# Patient Record
Sex: Female | Born: 1950
Health system: Southern US, Community
[De-identification: ages and names within clinical notes are randomized; demographics above are authoritative.]

## PROBLEM LIST (undated history)

## (undated) DIAGNOSIS — N852 Hypertrophy of uterus: Secondary | ICD-10-CM

## (undated) DIAGNOSIS — B019 Varicella without complication: Secondary | ICD-10-CM

## (undated) DIAGNOSIS — O039 Complete or unspecified spontaneous abortion without complication: Secondary | ICD-10-CM

## (undated) HISTORY — DX: Hypertrophy of uterus: N85.2

## (undated) HISTORY — PX: LUMBAR DISC SURGERY: SHX700

## (undated) HISTORY — DX: Complete or unspecified spontaneous abortion without complication: O03.9

## (undated) HISTORY — PX: BREAST CYST ASPIRATION: SHX578

## (undated) HISTORY — DX: Varicella without complication: B01.9

## (undated) HISTORY — PX: BREAST EXCISIONAL BIOPSY: SUR124

## (undated) HISTORY — PX: OOPHORECTOMY: SHX86

## (undated) HISTORY — PX: BREAST SURGERY: SHX581

---

## 1999-05-01 ENCOUNTER — Other Ambulatory Visit: Admission: RE | Admit: 1999-05-01 | Discharge: 1999-05-01 | Payer: Self-pay | Admitting: Gynecology

## 1999-08-27 ENCOUNTER — Encounter: Admission: RE | Admit: 1999-08-27 | Discharge: 1999-11-25 | Payer: Self-pay | Admitting: Gynecology

## 2000-08-04 ENCOUNTER — Other Ambulatory Visit: Admission: RE | Admit: 2000-08-04 | Discharge: 2000-08-04 | Payer: Self-pay | Admitting: Gynecology

## 2000-10-12 HISTORY — PX: LAMINECTOMY: SHX219

## 2000-10-12 LAB — HM COLONOSCOPY: HM Colonoscopy: NORMAL

## 2000-11-12 DIAGNOSIS — N852 Hypertrophy of uterus: Secondary | ICD-10-CM

## 2000-11-12 HISTORY — DX: Hypertrophy of uterus: N85.2

## 2000-11-12 HISTORY — PX: ABDOMINAL HYSTERECTOMY: SHX81

## 2000-12-09 ENCOUNTER — Inpatient Hospital Stay (HOSPITAL_COMMUNITY): Admission: RE | Admit: 2000-12-09 | Discharge: 2000-12-12 | Payer: Self-pay | Admitting: Gynecology

## 2000-12-09 ENCOUNTER — Encounter (INDEPENDENT_AMBULATORY_CARE_PROVIDER_SITE_OTHER): Payer: Self-pay

## 2001-02-07 ENCOUNTER — Encounter: Admission: RE | Admit: 2001-02-07 | Discharge: 2001-02-07 | Payer: Self-pay | Admitting: Neurosurgery

## 2001-02-07 ENCOUNTER — Encounter: Payer: Self-pay | Admitting: Neurosurgery

## 2001-02-22 ENCOUNTER — Ambulatory Visit (HOSPITAL_COMMUNITY): Admission: RE | Admit: 2001-02-22 | Discharge: 2001-02-23 | Payer: Self-pay | Admitting: Neurosurgery

## 2001-02-22 ENCOUNTER — Encounter: Payer: Self-pay | Admitting: Neurosurgery

## 2001-04-04 ENCOUNTER — Encounter: Admission: RE | Admit: 2001-04-04 | Discharge: 2001-05-03 | Payer: Self-pay | Admitting: Neurosurgery

## 2001-08-08 ENCOUNTER — Other Ambulatory Visit: Admission: RE | Admit: 2001-08-08 | Discharge: 2001-08-08 | Payer: Self-pay | Admitting: Gynecology

## 2001-11-09 ENCOUNTER — Inpatient Hospital Stay (HOSPITAL_COMMUNITY): Admission: RE | Admit: 2001-11-09 | Discharge: 2001-11-11 | Payer: Self-pay | Admitting: Neurosurgery

## 2001-11-09 ENCOUNTER — Encounter: Payer: Self-pay | Admitting: Neurosurgery

## 2002-04-06 ENCOUNTER — Encounter: Admission: RE | Admit: 2002-04-06 | Discharge: 2002-07-05 | Payer: Self-pay

## 2002-07-14 ENCOUNTER — Encounter: Admission: RE | Admit: 2002-07-14 | Discharge: 2002-10-12 | Payer: Self-pay

## 2002-08-08 ENCOUNTER — Other Ambulatory Visit: Admission: RE | Admit: 2002-08-08 | Discharge: 2002-08-08 | Payer: Self-pay | Admitting: Gynecology

## 2002-08-10 ENCOUNTER — Encounter: Admission: RE | Admit: 2002-08-10 | Discharge: 2002-08-10 | Payer: Self-pay | Admitting: Gynecology

## 2002-08-10 ENCOUNTER — Encounter: Payer: Self-pay | Admitting: Gynecology

## 2002-10-24 ENCOUNTER — Encounter: Admission: RE | Admit: 2002-10-24 | Discharge: 2003-01-22 | Payer: Self-pay

## 2003-02-12 ENCOUNTER — Encounter: Payer: Self-pay | Admitting: Gynecology

## 2003-02-12 ENCOUNTER — Encounter: Admission: RE | Admit: 2003-02-12 | Discharge: 2003-02-12 | Payer: Self-pay | Admitting: Gynecology

## 2003-07-05 ENCOUNTER — Ambulatory Visit (HOSPITAL_COMMUNITY): Admission: RE | Admit: 2003-07-05 | Discharge: 2003-07-05 | Payer: Self-pay | Admitting: Gastroenterology

## 2003-08-06 ENCOUNTER — Encounter: Admission: RE | Admit: 2003-08-06 | Discharge: 2003-08-06 | Payer: Self-pay | Admitting: Gynecology

## 2003-08-06 ENCOUNTER — Encounter: Payer: Self-pay | Admitting: Gynecology

## 2003-09-11 ENCOUNTER — Other Ambulatory Visit: Admission: RE | Admit: 2003-09-11 | Discharge: 2003-09-11 | Payer: Self-pay | Admitting: Gynecology

## 2004-08-07 ENCOUNTER — Encounter: Admission: RE | Admit: 2004-08-07 | Discharge: 2004-08-07 | Payer: Self-pay | Admitting: Gynecology

## 2004-09-16 ENCOUNTER — Other Ambulatory Visit: Admission: RE | Admit: 2004-09-16 | Discharge: 2004-09-16 | Payer: Self-pay | Admitting: Gynecology

## 2005-08-25 ENCOUNTER — Encounter: Admission: RE | Admit: 2005-08-25 | Discharge: 2005-08-25 | Payer: Self-pay | Admitting: Gynecology

## 2005-09-24 ENCOUNTER — Other Ambulatory Visit: Admission: RE | Admit: 2005-09-24 | Discharge: 2005-09-24 | Payer: Self-pay | Admitting: Gynecology

## 2006-08-27 ENCOUNTER — Encounter: Admission: RE | Admit: 2006-08-27 | Discharge: 2006-08-27 | Payer: Self-pay | Admitting: Internal Medicine

## 2006-09-08 ENCOUNTER — Encounter: Admission: RE | Admit: 2006-09-08 | Discharge: 2006-09-08 | Payer: Self-pay | Admitting: Internal Medicine

## 2006-09-23 ENCOUNTER — Encounter: Admission: RE | Admit: 2006-09-23 | Discharge: 2006-09-23 | Payer: Self-pay | Admitting: *Deleted

## 2006-12-17 ENCOUNTER — Other Ambulatory Visit: Admission: RE | Admit: 2006-12-17 | Discharge: 2006-12-17 | Payer: Self-pay | Admitting: *Deleted

## 2007-05-02 ENCOUNTER — Encounter: Admission: RE | Admit: 2007-05-02 | Discharge: 2007-05-02 | Payer: Self-pay | Admitting: *Deleted

## 2007-09-01 ENCOUNTER — Encounter: Admission: RE | Admit: 2007-09-01 | Discharge: 2007-09-01 | Payer: Self-pay | Admitting: *Deleted

## 2007-12-28 ENCOUNTER — Other Ambulatory Visit: Admission: RE | Admit: 2007-12-28 | Discharge: 2007-12-28 | Payer: Self-pay | Admitting: Gynecology

## 2008-03-29 ENCOUNTER — Ambulatory Visit: Payer: Self-pay | Admitting: Family Medicine

## 2008-03-29 DIAGNOSIS — D1800 Hemangioma unspecified site: Secondary | ICD-10-CM | POA: Insufficient documentation

## 2008-03-29 LAB — CONVERTED CEMR LAB: Pap Smear: NORMAL

## 2008-09-04 ENCOUNTER — Encounter: Admission: RE | Admit: 2008-09-04 | Discharge: 2008-09-04 | Payer: Self-pay | Admitting: Family Medicine

## 2008-09-14 ENCOUNTER — Telehealth: Payer: Self-pay | Admitting: Family Medicine

## 2009-01-03 ENCOUNTER — Other Ambulatory Visit: Admission: RE | Admit: 2009-01-03 | Discharge: 2009-01-03 | Payer: Self-pay | Admitting: Gynecology

## 2009-01-03 ENCOUNTER — Encounter: Payer: Self-pay | Admitting: Women's Health

## 2009-01-03 ENCOUNTER — Ambulatory Visit: Payer: Self-pay | Admitting: Women's Health

## 2009-02-11 ENCOUNTER — Ambulatory Visit: Payer: Self-pay | Admitting: Family Medicine

## 2009-02-11 DIAGNOSIS — M25559 Pain in unspecified hip: Secondary | ICD-10-CM | POA: Insufficient documentation

## 2009-02-11 LAB — CONVERTED CEMR LAB
ALT: 12 units/L (ref 0–35)
AST: 19 units/L (ref 0–37)
Albumin: 4.1 g/dL (ref 3.5–5.2)
Alkaline Phosphatase: 64 units/L (ref 39–117)
BUN: 10 mg/dL (ref 6–23)
Basophils Absolute: 0 10*3/uL (ref 0.0–0.1)
Basophils Relative: 0 % (ref 0.0–3.0)
Bilirubin, Direct: 0.1 mg/dL (ref 0.0–0.3)
CO2: 35 meq/L — ABNORMAL HIGH (ref 19–32)
Calcium: 10 mg/dL (ref 8.4–10.5)
Chloride: 107 meq/L (ref 96–112)
Cholesterol: 182 mg/dL (ref 0–200)
Creatinine, Ser: 0.8 mg/dL (ref 0.4–1.2)
Eosinophils Absolute: 0 10*3/uL (ref 0.0–0.7)
Eosinophils Relative: 0.9 % (ref 0.0–5.0)
GFR calc non Af Amer: 94.69 mL/min (ref >60)
Glucose, Bld: 99 mg/dL (ref 70–99)
HCT: 41 % (ref 36.0–46.0)
HDL: 62.4 mg/dL (ref >39.00)
Hemoglobin: 14.1 g/dL (ref 12.0–15.0)
LDL Cholesterol: 104 mg/dL — ABNORMAL HIGH (ref 0–99)
Lymphocytes Relative: 30.9 % (ref 12.0–46.0)
Lymphs Abs: 1.4 10*3/uL (ref 0.7–4.0)
MCHC: 34.5 g/dL (ref 30.0–36.0)
MCV: 86 fL (ref 78.0–100.0)
Monocytes Absolute: 0.2 10*3/uL (ref 0.1–1.0)
Monocytes Relative: 5.2 % (ref 3.0–12.0)
Neutro Abs: 2.9 10*3/uL (ref 1.4–7.7)
Neutrophils Relative %: 63 % (ref 43.0–77.0)
Platelets: 186 10*3/uL (ref 150.0–400.0)
Potassium: 4.6 meq/L (ref 3.5–5.1)
RBC: 4.77 M/uL (ref 3.87–5.11)
RDW: 12.7 % (ref 11.5–14.6)
Sodium: 146 meq/L — ABNORMAL HIGH (ref 135–145)
TSH: 1.25 microintl units/mL (ref 0.35–5.50)
Total Bilirubin: 1.3 mg/dL — ABNORMAL HIGH (ref 0.3–1.2)
Total CHOL/HDL Ratio: 3
Total Protein: 7.5 g/dL (ref 6.0–8.3)
Triglycerides: 76 mg/dL (ref 0.0–149.0)
VLDL: 15.2 mg/dL (ref 0.0–40.0)
WBC: 4.5 10*3/uL (ref 4.5–10.5)

## 2009-05-13 ENCOUNTER — Ambulatory Visit: Payer: Self-pay | Admitting: Family Medicine

## 2009-05-13 ENCOUNTER — Telehealth: Payer: Self-pay | Admitting: Family Medicine

## 2009-05-13 DIAGNOSIS — M5020 Other cervical disc displacement, unspecified cervical region: Secondary | ICD-10-CM

## 2009-05-14 ENCOUNTER — Telehealth: Payer: Self-pay | Admitting: Family Medicine

## 2009-05-18 ENCOUNTER — Encounter: Admission: RE | Admit: 2009-05-18 | Discharge: 2009-05-18 | Payer: Self-pay | Admitting: Neurosurgery

## 2009-09-17 ENCOUNTER — Encounter: Admission: RE | Admit: 2009-09-17 | Discharge: 2009-09-17 | Payer: Self-pay | Admitting: Family Medicine

## 2009-11-07 ENCOUNTER — Ambulatory Visit: Payer: Self-pay | Admitting: Family Medicine

## 2009-12-26 ENCOUNTER — Ambulatory Visit: Payer: Self-pay | Admitting: Women's Health

## 2010-01-09 ENCOUNTER — Other Ambulatory Visit: Admission: RE | Admit: 2010-01-09 | Discharge: 2010-01-09 | Payer: Self-pay | Admitting: Gynecology

## 2010-01-09 ENCOUNTER — Ambulatory Visit: Payer: Self-pay | Admitting: Women's Health

## 2010-01-10 LAB — CONVERTED CEMR LAB: Pap Smear: NORMAL

## 2010-01-20 ENCOUNTER — Ambulatory Visit: Payer: Self-pay | Admitting: Internal Medicine

## 2010-01-20 DIAGNOSIS — M79609 Pain in unspecified limb: Secondary | ICD-10-CM

## 2010-02-24 ENCOUNTER — Ambulatory Visit: Payer: Self-pay | Admitting: Internal Medicine

## 2010-02-27 ENCOUNTER — Ambulatory Visit: Payer: Self-pay | Admitting: Internal Medicine

## 2010-02-28 LAB — CONVERTED CEMR LAB
BUN: 16 mg/dL (ref 6–23)
CO2: 30 meq/L (ref 19–32)
Calcium: 9.4 mg/dL (ref 8.4–10.5)
Chloride: 101 meq/L (ref 96–112)
Creatinine, Ser: 0.8 mg/dL (ref 0.4–1.2)
GFR calc non Af Amer: 90.42 mL/min (ref >60)
Glucose, Bld: 91 mg/dL (ref 70–99)
Potassium: 5.7 meq/L — ABNORMAL HIGH (ref 3.5–5.1)
Sodium: 140 meq/L (ref 135–145)

## 2010-03-11 ENCOUNTER — Ambulatory Visit: Payer: Self-pay | Admitting: Internal Medicine

## 2010-03-12 LAB — CONVERTED CEMR LAB
BUN: 11 mg/dL (ref 6–23)
CO2: 32 meq/L (ref 19–32)
Calcium: 9.7 mg/dL (ref 8.4–10.5)
Chloride: 101 meq/L (ref 96–112)
Creatinine, Ser: 0.9 mg/dL (ref 0.4–1.2)
GFR calc non Af Amer: 85.63 mL/min (ref >60)
Glucose, Bld: 97 mg/dL (ref 70–99)
Potassium: 5.6 meq/L — ABNORMAL HIGH (ref 3.5–5.1)
Sodium: 141 meq/L (ref 135–145)

## 2010-03-13 ENCOUNTER — Ambulatory Visit: Payer: Self-pay | Admitting: Internal Medicine

## 2010-03-14 LAB — CONVERTED CEMR LAB
Creatinine, Ser: 0.7 mg/dL (ref 0.4–1.2)
Potassium: 3.9 meq/L (ref 3.5–5.1)
Sodium: 140 meq/L (ref 135–145)

## 2010-07-30 ENCOUNTER — Ambulatory Visit: Payer: Self-pay | Admitting: Internal Medicine

## 2010-09-23 ENCOUNTER — Encounter
Admission: RE | Admit: 2010-09-23 | Discharge: 2010-09-23 | Payer: Self-pay | Source: Home / Self Care | Attending: Internal Medicine | Admitting: Internal Medicine

## 2010-09-23 LAB — HM MAMMOGRAPHY

## 2010-11-09 LAB — CONVERTED CEMR LAB
ALT: 13 units/L (ref 0–35)
AST: 20 units/L (ref 0–37)
Albumin: 4.3 g/dL (ref 3.5–5.2)
Alkaline Phosphatase: 57 units/L (ref 39–117)
BUN: 14 mg/dL (ref 6–23)
Basophils Absolute: 0 10*3/uL (ref 0.0–0.1)
Basophils Relative: 0.4 % (ref 0.0–3.0)
Bilirubin Urine: NEGATIVE
Bilirubin, Direct: 0.2 mg/dL (ref 0.0–0.3)
Blood in Urine, dipstick: NEGATIVE
CO2: 33 meq/L — ABNORMAL HIGH (ref 19–32)
Calcium: 10.1 mg/dL (ref 8.4–10.5)
Chloride: 105 meq/L (ref 96–112)
Cholesterol: 175 mg/dL (ref 0–200)
Creatinine, Ser: 0.8 mg/dL (ref 0.4–1.2)
Eosinophils Absolute: 0.1 10*3/uL (ref 0.0–0.7)
Eosinophils Relative: 1.6 % (ref 0.0–5.0)
GFR calc non Af Amer: 90.42 mL/min (ref >60)
Glucose, Bld: 99 mg/dL (ref 70–99)
Glucose, Urine, Semiquant: NEGATIVE
HCT: 40.8 % (ref 36.0–46.0)
HDL: 65 mg/dL (ref >39.00)
Hemoglobin: 14.1 g/dL (ref 12.0–15.0)
Hgb A1c MFr Bld: 5.8 % (ref 4.6–6.5)
Ketones, urine, test strip: NEGATIVE
LDL Cholesterol: 98 mg/dL (ref 0–99)
Lymphocytes Relative: 31.1 % (ref 12.0–46.0)
Lymphs Abs: 1.3 10*3/uL (ref 0.7–4.0)
MCHC: 34.6 g/dL (ref 30.0–36.0)
MCV: 85.1 fL (ref 78.0–100.0)
Monocytes Absolute: 0.3 10*3/uL (ref 0.1–1.0)
Monocytes Relative: 7.1 % (ref 3.0–12.0)
Neutro Abs: 2.5 10*3/uL (ref 1.4–7.7)
Neutrophils Relative %: 59.8 % (ref 43.0–77.0)
Nitrite: NEGATIVE
Platelets: 211 10*3/uL (ref 150.0–400.0)
Potassium: 5.6 meq/L — ABNORMAL HIGH (ref 3.5–5.1)
Protein, U semiquant: NEGATIVE
RBC: 4.8 M/uL (ref 3.87–5.11)
RDW: 13.9 % (ref 11.5–14.6)
Sodium: 146 meq/L — ABNORMAL HIGH (ref 135–145)
Specific Gravity, Urine: 1.02
TSH: 1.91 microintl units/mL (ref 0.35–5.50)
Total Bilirubin: 1.1 mg/dL (ref 0.3–1.2)
Total CHOL/HDL Ratio: 3
Total Protein: 7.1 g/dL (ref 6.0–8.3)
Triglycerides: 60 mg/dL (ref 0.0–149.0)
Urobilinogen, UA: 0.2
VLDL: 12 mg/dL (ref 0.0–40.0)
WBC: 4.2 10*3/uL — ABNORMAL LOW (ref 4.5–10.5)
pH: 7

## 2010-11-11 NOTE — Assessment & Plan Note (Signed)
Summary: fup meds//ccm   Vital Signs:  Patient profile:   60 year old female Weight:      144 pounds Temp:     98.6 degrees F oral BP sitting:   110 / 70  (left arm) Cuff size:   regular  Vitals Entered By: Kern Reap CMA Duncan Dull) (November 07, 2009 2:41 PM)  Reason for Visit med check  History of Present Illness: Elizabeth Rollins is a 60 year old female, nonsmoker, who states that she is on disability because of chronic back pain.  She last saw her neurosurgeon, Dr. Jeral Fruit in September of 2010.  At that time.  He recommended either epidural steroid injections or Mobic 15 mg daily.  She selected.  The study on the Mobic 15 mg daily.  She states she is frustrated because we do not have the details of her neurosurgical evaluation with Dr. Jeral Fruit.  I explained to her that I do not need the details of her neurosurgical evaluation because any treatment is really provided by him.  She is due in March for annual exam.  She is here today to get her Mobic refill  Allergies: 1)  ! Sulfa  Past History:  Past medical, surgical, family and social histories (including risk factors) reviewed for relevance to current acute and chronic problems.  Past Medical History: Reviewed history from 03/29/2008 and no changes required. lumbar disk surgery x 2 TAH and BSO for nonmalignant reasons miscarriage x 1.  No children  Family History: Reviewed history from 03/29/2008 and no changes required. father died at 35, MI, and diabetes.  Mother died 29, pulmonary embolus, hypertension.  Three brothers two diabetic one in good health.  Two sisters one has allergies.  There is diabetes and congestive heart failure  Social History: Reviewed history from 03/29/2008 and no changes required. Occupation: Married Never Smoked Alcohol use-no Drug use-no Regular exercise-yes  Physical Exam  General:  Well-developed,well-nourished,in no acute distress; alert,appropriate and cooperative throughout  examination   Impression & Recommendations:  Problem # 1:  DEGENERATIVE DISC DISEASE, CERVICAL SPINE, W/RADICULOPATHY (ICD-722.0) Assessment Unchanged  Orders: Prescription Created Electronically [phone])  Complete Medication List: 1)  Vitamin D  2)  Calcium  3)  Fish Oil  4)  Vitamin E  5)  Garlic  6)  Magnesium  7)  Meloxicam 15 Mg Tabs (Meloxicam) .... Take one tab once daily  Patient Instructions: 1)  continue the Mobic 15 mg daily.  Schedule a physical examination in March.  We will do your lab work.  The same day, you come in for your physical Prescriptions: MELOXICAM 15 MG TABS (MELOXICAM) take one tab once daily  #100 x 3   Entered and Authorized by:   Roderick Pee MD   Signed by:   Roderick Pee MD on 11/07/2009   Method used:   Electronically to        [facility]. [phone]* (retail)       [address]       [place], [place]  [address]       Ph: [phone]       Fax: [phone]   RxID:   [id number]

## 2010-11-11 NOTE — Assessment & Plan Note (Signed)
Summary: cpx---will fast--ok per dr Fabian Sharp to work in//ccm   Vital Signs:  Patient profile:   60 year old female Menstrual status:  hysterectomy Height:      66.5 inches Weight:      139 pounds BMI:     22.18 Temp:     98.2 degrees F oral Pulse rate:   92 / minute BP sitting:   104 / 80  (right arm)  Vitals Entered By: Kathrynn Speed CMA (Feb 24, 2010 8:39 AM) CC: CPX/ No Pap hyst has gyn Nutritional Status Detail -has   History of Present Illness: Elizabeth Rollins  comes in today for preventive visit .  Since last visit  : In regard to her radicular neck arm .symptoms    she  DId see  Dr Jeral Fruit  and   prednisone  had  helped.   is to be on meloxicam for 8 weeks  and  still has some numbness no pain.  In home traction rx .    some help.    to follow up with him after completion of course of program otherwise health is good . Concern about diabetes in family but no hx of same .   no signs of diabetes. tries to stay  follow healthy lifestyle   Preventive Care Screening  Prior Values:    Pap Smear:  normal (01/10/2010)    Mammogram:  ASSESSMENT: Negative - BI-RADS 1^MM DIGITAL SCREENING (09/17/2009)    Colonoscopy:  normal (10/12/2000)   Preventive Screening-Counseling & Management  Alcohol-Tobacco     Alcohol drinks/day: 0     Smoking Status: quit     Year Quit: 1985  Caffeine-Diet-Exercise     Caffeine use/day: 0     Does Patient Exercise: yes     Type of exercise: swimming    water aerobic      Times/week: 3  Hep-HIV-STD-Contraception     Dental Visit-last 6 months yes  Safety-Violence-Falls     Seat Belt Use: yes     Firearms in the Home: no firearms in the home     Smoke Detectors: yes     Violence in the Home: no risk noted     Sexual Abuse: no     Fall Risk: no       Drug Use:  no.        Blood Transfusions:  no.    Current Medications (verified): 1)  Vitamin D 2)  Calcium 3)  Fish Oil 4)  Garlic 5)  Magnesium 6)  Meloxicam 15 Mg Tabs (Meloxicam)  .... Take One Tab Once Daily  Allergies (verified): 1)  ! Sulfa  Past History:  Past medical, surgical, family and social histories (including risk factors) reviewed, and no changes noted (except as noted below).  Past Medical History: Reviewed history from 01/20/2010 and no changes required. lumbar disk surgery x 2 TAH and BSO for nonmalignant reasons  bleeding  miscarriage x 1.  No children Varicella as a child  Past Surgical History: Reviewed history from 01/20/2010 and no changes required. laminectomy  2002  and   repeat  Hysterectomy  Past History:  Care Management: Gynecology: Perlie Gold and then Daphine Deutscher over 1 year ago  Neurology: Erma Heritage  Family History: Reviewed history from 03/29/2008 and no changes required. father died at 21, MI, and diabetes.  Mother died 15, pulmonary embolus, hypertension.  Three brothers two diabetic one in good health.  Two sisters one has allergies.  There is diabetes and congestive heart  failure ( 3 sis with diabetes  and on prediabetes)   Social History: Reviewed history from 01/20/2010 and no changes required. 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  [employer]   am tobacco  Married  HHof 2  Never Smoked Alcohol use-no Drug use-no Regular exercise-yes Fall Risk:  no   Review of Systems       12 system review  negative except  tthe neck back arm radicular signs   wears glasses  Physical Exam General Appearance: well developed, well nourished, no acute distress Eyes: conjunctiva and lids normal, PERRLA, EOM  soe tropism wearing glasses  Ears, Nose, Mouth, Throat: TM clear, nares clear, oral exam WNL Neck: supple, no lymphadenopathy, no thyromegaly, no JVD Respiratory: clear to auscultation and percussion, respiratory effort normal Cardiovascular: regular rate and rhythm, S1-S2, no murmur, rub or gallop, no bruits, peripheral pulses normal and symmetric, no cyanosis, clubbing, edema  or varicosities Chest: no scars, masses, tenderness; no asymmetry, skin changes, nipple discharge   Gastrointestinal: soft, non-tender; no hepatosplenomegaly, masses; active bowel sounds all quadrants,  Genitourinary: per GYne pap march 2011 Lymphatic: no cervical, axillary or inguinal adenopathy Musculoskeletal: gait normal, muscle tone and strength WNL, no joint swelling, effusions, discoloration, crepitus  Skin: clear, good turgor, color WNL, no rashes, lesions, or ulcerations Neurologic: normal mental status, normal reflexes, normal strength, , and motion dec sense  percieved ring pinky on right .  slightl stiffness in neck .  Psychiatric: alert; oriented to person, place and time Other Exam:   EKG  rate 70   ectopic pwaves /   r r nl intervals .       Impression & Recommendations:  Problem # 1:  PREVENTIVE HEALTH CARE (ICD-V70.0) continue healthy lifestyle   Orders: TLB-TSH (Thyroid Stimulating Hormone) (84443-TSH) TLB-Hepatic/Liver Function Pnl (80076-HEPATIC) TLB-CBC Platelet - w/Differential (85025-CBCD) TLB-BMP (Basic Metabolic Panel-BMET) (80048-METABOL) TLB-Lipid Panel (80061-LIPID) TLB-A1C / Hgb A1C (Glycohemoglobin) (83036-A1C) UA Dipstick w/o Micro (automated)  (81003) Venipuncture (86578) EKG w/ Interpretation (93000)  Problem # 2:  DEGENERATIVE DISC DISEASE, CERVICAL SPINE, W/RADICULOPATHY (ICD-722.0) under care  some improvement  with traction exercises and  nsaid .   TO follow up with Dr Jeral Fruit . Orders: TLB-TSH (Thyroid Stimulating Hormone) (84443-TSH) TLB-Hepatic/Liver Function Pnl (80076-HEPATIC) TLB-CBC Platelet - w/Differential (85025-CBCD) TLB-BMP (Basic Metabolic Panel-BMET) (80048-METABOL) TLB-Lipid Panel (80061-LIPID)  Problem # 3:  DIABETES MELLITUS, FAMILY HX (ICD-V18.0) very strong hx of dm ...   continue exercise as tolerated and healthy lifestyle  Orders: TLB-TSH (Thyroid Stimulating Hormone) (84443-TSH) TLB-Hepatic/Liver Function Pnl  (80076-HEPATIC) TLB-CBC Platelet - w/Differential (85025-CBCD) TLB-BMP (Basic Metabolic Panel-BMET) (80048-METABOL) TLB-Lipid Panel (80061-LIPID) TLB-A1C / Hgb A1C (Glycohemoglobin) (83036-A1C)  Complete Medication List: 1)  Vitamin D  2)  Calcium  3)  Fish Oil  4)  Garlic  5)  Magnesium  6)  Meloxicam 15 Mg Tabs (Meloxicam) .... Take one tab once daily  Other Orders: Tdap => 11yrs IM (46962) Admin 1st Vaccine (95284)  Patient Instructions: 1)  You will be informed of lab results when available.  2)  Continue healthy lifestyle   3)  Check up yearly    or as needed.   requests copy of labs   Immunizations Administered:  Tetanus Vaccine:    Vaccine Type: Tdap    Site: right deltoid    Mfr: GlaxoSmithKline    Dose: 0.5 ml    Route: IM    Given by: Romualdo Bolk, CMA (AAMA)    Exp. Date:  01/04/2012    Lot #: [id number]  Laboratory Results   Urine Tests    Routine Urinalysis   Color: yellow Appearance: Clear Glucose: negative   (Normal Range: Negative) Bilirubin: negative   (Normal Range: Negative) Ketone: negative   (Normal Range: Negative) Spec. Gravity: 1.020   (Normal Range: 1.003-1.035) Blood: negative   (Normal Range: Negative) pH: 7.0   (Normal Range: 5.0-8.0) Protein: negative   (Normal Range: Negative) Urobilinogen: 0.2   (Normal Range: 0-1) Nitrite: negative   (Normal Range: Negative) Leukocyte Esterace: trace   (Normal Range: Negative)    Comments: Rita Ohara  Feb 24, 2010 11:13 AM

## 2010-11-11 NOTE — Assessment & Plan Note (Signed)
Summary: to be est/ok per doc/njr   Vital Signs:  Patient profile:   60 year old female Menstrual status:  hysterectomy Height:      66.25 inches Weight:      141 pounds BMI:     22.67 Pulse rate:   87 / minute BP sitting:   110 / 80  (left arm) Cuff size:   regular  Vitals Entered By: Romualdo Bolk, CMA Duncan Dull) (January 20, 2010 1:48 PM) CC: New Pt to get establish-Pt is having problems with back     Menstrual Status hysterectomy Last PAP Result normal   History of Present Illness: Elizabeth Rollins comesin today transferring care for     above reason . she has been seeing Dr Tawanna Cooler .   She has been having a problem with Upper back bone spur   for quite a wheile and  now pain in right arm and radiating to finger.   meds not working   and ? if injection would help as rec by Dr Jeral Fruit   Meloxicam ,is what she is taking recnetly without se . Hasnt seem Dr Jeral Fruit  him since August when he offered injection to try.  After her back surgeries she notes that  itwas hard to get off  pain meds and thus doesn want narcotics unless absolutely necessary s.    Likesto be proactive   .   Asks for  other rx.  or advice   Is due for PV soon. with labs.   Preventive Care Screening  Pap Smear:    Date:  01/10/2010    Results:  normal   Colonoscopy:    Date:  10/12/2000    Results:  normal    Preventive Screening-Counseling & Management  Alcohol-Tobacco     Alcohol drinks/day: 0     Smoking Status: quit     Year Quit: 1985  Caffeine-Diet-Exercise     Caffeine use/day: 0     Does Patient Exercise: yes  Hep-HIV-STD-Contraception     Dental Visit-last 6 months yes  Safety-Violence-Falls     Seat Belt Use: yes     Firearms in the Home: no firearms in the home     Smoke Detectors: yes      Blood Transfusions:  no.    Current Medications (verified): 1)  Vitamin D 2)  Calcium 3)  Fish Oil 4)  Garlic 5)  Magnesium 6)  Meloxicam 15 Mg Tabs (Meloxicam) .... Take One Tab Once  Daily 7)  Flexeril 10 Mg Tabs (Cyclobenzaprine Hcl) .[name].. 1 By Mouth Once Daily  Allergies (verified): 1)  ! Sulfa  Past History:  Past Medical History: lumbar disk surgery x 2 TAH and BSO for nonmalignant reasons  bleeding  miscarriage x 1.  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  [employer]   am tobacco  Married  HHof 2  Never Smoked Alcohol use-no Drug use-no Regular exercise-yes Caffeine use/day:  0 Smoking Status:  quit Seat Belt Use:  yes Dental Care w/in 6 mos.:  yes Blood Transfusions:  no  Review of Systems  The patient denies anorexia, fever, weight loss, weight gain, vision loss, decreased hearing, hoarseness, chest pain, syncope, dyspnea on exertion, peripheral edema, prolonged cough,  headaches, hemoptysis, abdominal pain, melena, hematochezia, severe indigestion/heartburn, hematuria, muscle weakness, transient blindness, difficulty walking, unusual weight change, abnormal bleeding, and enlarged lymph nodes.         except for hpi  Physical Exam  General:  Well-developed,well-nourished,in no acute distress; alert,appropriate and cooperative throughout examination Head:  normocephalic and atraumatic.   Eyes:  vision grossly intact, pupils equal, and pupils round.   Mouth:  pharynx pink and moist.   Neck:  no masses and no thyromegaly.   Lungs:  Normal respiratory effort, chest expands symmetrically. Lungs are clear to auscultation, no crackles or wheezes. Heart:  Normal rate and regular rhythm. S1 and S2 normal without gallop, murmur, click, rub or other extra sounds.no lifts.   Abdomen:  Bowel sounds positive,abdomen soft and non-tender without masses, organomegaly or noted. Msk:  no  joint swelling and no joint warmth.  no atrophy  grip seems ok ue  Pulses:  pulses intact without delay   Extremities:  no clubbing cyanosis or edema  Neurologic:  alert & oriented X3, strength normal in all extremities,? and gait normal.  alert & oriented X3, strength normal in all extremities, gait normal, and DTRs symmetrical and normal.   Skin:  turgor normal, color normal, no ecchymoses, and no petechiae.   Cervical Nodes:  No lymphadenopathy noted Psych:  Oriented X3, normally interactive, good eye contact, not anxious appearing, and not depressed appearing.     Impression & Recommendations:  Problem # 1:  ARM PAIN, RIGHT (ICD-729.5) failed  antiinflammatory   for a week and didnt help  disc and will do pred rx and follow up   Problem # 2:  PREVENTIVE HEALTH CARE (ICD-V70.0) needs   updated and labs with med effects,  disc  plan and options.    Problem # 3:  DEGENERATIVE DISC DISEASE, CERVICAL SPINE, W/RADICULOPATHY (ICD-722.0) Assessment: Comment Only  Complete Medication List: 1)  Vitamin D  2)  Calcium  3)  Fish Oil  4)  Garlic  5)  Magnesium  6)  Meloxicam 15 Mg Tabs (Meloxicam) .... Take one tab once daily 7)  Flexeril 10 Mg Tabs (Cyclobenzaprine hcl) .[name].. 1 by mouth once daily 8)  Prednisone 20 Mg Tabs (Prednisone) .... Take 3 per day for 3 days 2 per day for 3 days 2 aday for 3 days,1 for 3 days , 1/2  once daily for 3 days  or as directed  Patient Instructions: 1)  take prednisone to decrease inflammation  of nerve  and  see if this helps.  2)  Would still follow up with Dr Jeral Fruit .   3)  Schedule  annual wellness visit   in MAY  and will do labs at the visit. and then gof rom there .    Prescriptions: PREDNISONE 20 MG TABS (PREDNISONE) take 3 per day for 3 days 2 per day for 3 days 2 aday for 3 days,1 for 3 days , 1/2  once daily for 3 days  or as directed  #30 x 0   Entered and Authorized by:   Madelin Headings MD   Signed by:   Madelin Headings MD on 01/20/2010    Method used:   Electronically to        [facility]. [phone]* (retail)       [address]       [place], [place]  [address]       Ph:  [phone]       Fax: [phone]   RxID:   [id number]

## 2010-11-11 NOTE — Assessment & Plan Note (Signed)
Summary: left hand pain/njr   Vital Signs:  Patient profile:   60 year old female Menstrual status:  hysterectomy Weight:      143 pounds Temp:     98.1 degrees F oral Pulse rate:   72 / minute BP sitting:   120 / 80  (right arm) Cuff size:   regular  Vitals Entered By: Romualdo Bolk, CMA (AAMA) (July 30, 2010 12:12 PM) CC: Left hand having a drawing sensation from ring finger. Pt states that it was popping in places but not now. This has been going on for 3 weeks.   History of Present Illness: Elizabeth Rollins comes in today  for acute problem. for about a month has had off an on problem with left hand ( using more recently ) because of back and other issues. gets in pool a lot for therapy. gripping on side of pool a lot .  ? if related.  no injury otherwise and is right handed. Pain on palm area radiation without numbness and sometimes still,  Mobic for a week some help.  had been doing grip ball exercises and / no better  . some better this am .   Preventive Screening-Counseling & Management  Alcohol-Tobacco     Alcohol drinks/day: 0     Smoking Status: quit     Year Quit: 1985  Caffeine-Diet-Exercise     Caffeine use/day: 0     Does Patient Exercise: yes     Type of exercise: swimming    water aerobic      Times/week: 3  Current Medications (verified): 1)  Vitamin D 2)  Calcium 3)  Fish Oil 4)  Garlic 5)  Magnesium 6)  Meloxicam 15 Mg Tabs (Meloxicam) .... Take One Tab Once Daily  Allergies (verified): 1)  ! Sulfa  Contraindications/Deferment of Procedures/Staging:    Test/Procedure: FLU VAX    Reason for deferment: patient declined   Past History:  Past medical, surgical, family and social histories (including risk factors) reviewed, and no changes noted (except as noted below).  Past Medical History: Reviewed history from 01/20/2010 and no changes required. lumbar disk surgery x 2 TAH and BSO for nonmalignant reasons  bleeding  miscarriage x 1.   No children Varicella as a child  Past Surgical History: Reviewed history from 01/20/2010 and no changes required. laminectomy  2002  and   repeat  Hysterectomy  Past History:  Care Management: Gynecology: Perlie Gold and then Daphine Deutscher over 1 year ago  Neurology: Erma Heritage  Family History: Reviewed history from 02/24/2010 and no changes required. father died at 78, MI, and diabetes.  Mother died 73, pulmonary embolus, hypertension.  Three brothers two diabetic one in good health.  Two sisters one has allergies.  There is diabetes and congestive heart failure ( 3 sis with diabetes  and on prediabetes)   Social History: Reviewed history from 01/20/2010 and no changes required. 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  [employer]   am tobacco  Married  HHof 2  Never Smoked Alcohol use-no Drug use-no Regular exercise-yes  Review of Systems       no general signs fever   Physical Exam  General:  Well-developed,well-nourished,in no acute distress; alert,appropriate and cooperative throughout examination Msk:  left hand nl station and no swelling . no crepitus but very tender over 4th ring finger flexor tendon area . grip 5/5 some pain  no bony lesion  some pain on active and passive  rom.  Pulses:  pulses intact without delay   Extremities:  no clubbing cyanosis or edema    Impression & Recommendations:  Problem # 1:  HAND PAIN, LEFT (ICD-729.5) prob synovitis but very local tender   little fat pad but no obv atrophy  . disc realiteve rest and gripping etc and nsaid  short term . to follow up if persistent or  progressive   Orders: T-Hand Left 3 Views (73130TC)  Problem # 2:  ? of OTHER TENOSYNOVITIS OF HAND AND WRIST (ICD-727.05) see above   Complete Medication List: 1)  Vitamin D  2)  Calcium  3)  Fish Oil  4)  Garlic  5)  Magnesium  6)  Meloxicam 15 Mg Tabs (Meloxicam) .... Take one tab once daily  Patient  Instructions: 1)  this could be  tendinitis synovitis 2)  relative rest   and  take anitinflammatory for another week. 3)  You will be informed of lab xray  results when available.  4)  if persistent or  progressive  consider hand consult.    Orders Added: 1)  T-Hand Left 3 Views [73130TC] 2)  Est. Patient Level III [60454]

## 2011-01-21 ENCOUNTER — Other Ambulatory Visit (HOSPITAL_COMMUNITY)
Admission: RE | Admit: 2011-01-21 | Discharge: 2011-01-21 | Disposition: A | Discharge status: Home / Self Care | Payer: 59 | Source: Ambulatory Visit | Attending: Gynecology | Admitting: Gynecology

## 2011-01-21 ENCOUNTER — Encounter (INDEPENDENT_AMBULATORY_CARE_PROVIDER_SITE_OTHER): Payer: 59 | Admitting: Women's Health

## 2011-01-21 ENCOUNTER — Other Ambulatory Visit: Payer: Self-pay | Admitting: Women's Health

## 2011-01-21 DIAGNOSIS — Z01419 Encounter for gynecological examination (general) (routine) without abnormal findings: Secondary | ICD-10-CM

## 2011-01-21 DIAGNOSIS — N952 Postmenopausal atrophic vaginitis: Secondary | ICD-10-CM

## 2011-01-21 DIAGNOSIS — Z124 Encounter for screening for malignant neoplasm of cervix: Secondary | ICD-10-CM | POA: Insufficient documentation

## 2011-02-27 NOTE — Consult Note (Signed)
   NAME:  Elizabeth Rollins, Elizabeth Rollins                          ACCOUNT NO.:  [id number]   MEDICAL RECORD NO.:  [id number]                   PATIENT TYPE:  REC   LOCATION:                                       FACILITY:  [facility]   PHYSICIAN:  Sondra Come, D.O.                 DATE OF BIRTH:  01-01-1951   DATE OF CONSULTATION:  08/16/2002  DATE OF DISCHARGE:                                   CONSULTATION   FUNCTIONAL CAPACITY EVALUATION:  I reviewed functional capacity evaluation  report on the patient dated August 04, 2002.  According to the summary  sheet, the statistical data suggested that the client provided maximal  effort during testing and that she had 0/5 [place] signs.  The  recommendations, based on the functional capacity evaluation, include that  the patient demonstrate ability to perform at a light physical demand level,  lifting 20 pounds occasionally from 36 to 54 inches and 10 pounds frequently  from 30 to 72 inches.  The client's vertical range was limited due to  subjective pain complaints.  Non-material-handling activities produced  complaints of increased lumbosacral and right low back burning sensation.  The patient self-terminated continuous sitting and static squatting and  crouching activities.  Based on findings, it is recommended that the patient  maintain a light physical demand for lifting, lowering, carrying and pushing  and pulling activities as well as alternate between sitting and standing  postures as needed.  She is also to perform continuous sitting, standing and  repetitive bending from the waist, squatting, crouching and kneeling  activities on an occasional basis only and to avoid static squatting and  crouching activities.  I reviewed the functional capacity evaluation and  concur with the findings.  The patient should be able to perform at a light  physical-demand-level occupation.                                                Sondra Come, D.O.    JJW/MEDQ  D:  08/16/2002  T:  08/17/2002  Job:  [id number]   cc:   [address], [place], [place], [place]  [address] [place]

## 2011-02-27 NOTE — Op Note (Signed)
. [facility]  Patient:    Elizabeth Rollins, Elizabeth Rollins                       MRN: [id number] Proc. Date: 02/22/01 Adm. Date:  [id number] Disc. Date: [id number] Attending:  Danella Penton                           Operative Report  PREOPERATIVE DIAGNOSIS:  Right L5-S1 herniated disk.  POSTOPERATIVE DIAGNOSIS:  Right L5-S1 herniated disk.  OPERATION PERFORMED:  Right L5-S1 diskectomy, foraminotomy.  Microscope.  SURGEON:  Tanya Nones. Jeral Fruit, M.D.  ASSISTANT:  Hewitt Shorts, M.D.  ANESTHESIA:  General.  INDICATIONS FOR PROCEDURE:  The patient had been complaining of back and right leg pain.  She has failed with conservative treatment.  At the beginning she did not want to have surgery.  She came to my office last week complaining of worsening pain and wanted to proceed with surgery.  The surgery was fully explained as well as the risks ____________ history and physical.  DESCRIPTION OF PROCEDURE:  The patient was taken to the operating room.  She was positioned in a prone manner.  The skin was prepped with Betadine.  A midline incision from L5 to S1 was made.  Muscle was retracted on the right side.  X-ray showed that indeed, we were at the level of 5-1.  With the drill, we drilled the lower lamina of L5 and the upper part of S1.  We brought the microscope into the area and the yellow ligament was also excised.  We found that indeed she had some stenosis.  We drilled ____________ the facet to enter into the dural sac.  Retraction was made.  Foraminotomy had to be done previously to be able to mobilize the S1 nerve root.  Indeed there was a herniated disk laterally compromising the take-off of S1.  Incision was made and with the pituitary rongeur and using the microcuret, removal of degenerative disk was accomplished.  Most of the disk was going laterally and medially.  Having total diskectomy exploration above and below was negative. Valsalva  maneuver was negative.  The area was irrigated.  Fentanyl was left in the dural space and the wound was closed with Vicryl and Steri-Strip.  Patient did well. DD:  02/22/01 TD:  02/23/01 Job: [id number]

## 2011-02-27 NOTE — Op Note (Signed)
[place]. [facility]  Patient:    Elizabeth Rollins, Elizabeth Rollins Visit Number: [id number] MRN: [id number]          Service Type: SUR Location: 3000 3032 [id number] Attending Physician:  Danella Penton Dictated by:   Tanya Nones. Jeral Fruit, M.D. Proc. Date: 11/09/01 Admit Date:  11/09/2001                             Operative Report  PREOPERATIVE DIAGNOSIS:  Right L5-S1 herniated disk.  POSTOPERATIVE DIAGNOSIS:  Right L5-S1 herniated disk.  PROCEDURE: 1. Right L5 hemilaminectomy, lysis of adhesions, right L5-S1 total diskectomy    with foraminotomy. 2. Decompression of the S1 nerve root as well as the L5 nerve root. 3. Microscope.  SURGEON:  Tanya Nones. Jeral Fruit, M.D.  ASSISTANT:  Stefani Dama, M.D.  CLINICAL HISTORY:  The patient is being admitted because of back and right leg pain.  The patient underwent surgery about eight or nine months ago.  She was scheduled for early next month, but now the pain is getting worse and she called two days ago to move up the surgery.  The risks were explained in the history and physical.  DESCRIPTION OF PROCEDURE:  The patient was taken to the OR, and she was positioned in a prone manner.  The back was prepped with Betadine.  The previous incision was removed.  We did our dissection all the way down laterally, and we did retract the muscle.  Indeed, the patient has quite a bit of scar tissue along the lamina and spinous process.  With the Leksell we removed the scar tissue.  We did our x-ray, localizing the L5-S1 area.  There was some calcification.  There was almost no space.  At this time we flexed the back completely, and we drilled the _____ toward the left side.  With this, we brought the microscope into the area.  Indeed, there was some scar tissue and primarily we were able to see part of the lamina of L5.  Just to localize the normal dural sac with the drill, we did a hemilaminectomy of L5. Having done this, we were able  to find normal dura.  We went down with the removal of proximal calcified yellow ligament until we found the S1 nerve root.  Retraction was made, and indeed there was a large herniated disk with some fragment going up.  Mesially we started going up to the fragments, and they were compromising the takeoff of the L5 nerve root.  The L5 had quite a bit of scar tissue, and lysis was accomplished with plenty of foraminotomy. After this we entered the disk space, and total gross diskectomy with removal of quite a bit of degenerative disk was achieved not only medially but also distally.  A curette was done to be sure there were not any more fragments into the disk space.  Having total diskectomy, we investigated the foramen of S1, which was clean.  The foramen of L5 was also clean with no more evidence of any more disk.  The patient had some osteophyte which was removed, and some adhesions, which were essentially resected.  During the procedure we did an x-ray to confirm the area where we were.  At the end of the procedure we did a Valsalva maneuver up to 40 mmHg, which was essentially negative for any CSF leak.  Having done this, the area was irrigated.  Fentanyl and Depo-Medrol were  left in the epidural space, and the wound was closed with Vicryl and a Steri-Strip.  The patient went back to the recovery room. Dictated by:   Tanya Nones. Jeral Fruit, M.D. Attending Physician:  Danella Penton DD:  11/09/01 TD:  11/10/01 Job: [id number]

## 2011-02-27 NOTE — Consult Note (Signed)
NAME:  Elizabeth Rollins, Elizabeth Rollins                          ACCOUNT NO.:  [id number]   MEDICAL RECORD NO.:  [id number]                   PATIENT TYPE:  REC   LOCATION:  [facility]                                  FACILITY:  [facility]   PHYSICIAN:  Sondra Come, D.O.                 DATE OF BIRTH:  12/28/1950   DATE OF CONSULTATION:  07/17/2002  DATE OF DISCHARGE:                                   CONSULTATION   Elizabeth Rollins returns to clinic today for a reevaluation.  I last saw her on  May 15, 2002.  She has continued to have mild pain in her low back but  overall seems to have more good days than bad days.  She describes her pain  as a kink in her right lower back radiating to her right hip.  She points  to her lower lumbar paraspinous region.  Her function and quality of life  indexes have improved overall, but still remain somewhat declined.  She has  also undergone a functional capacity evaluation in the interim but I do not  have a copy of the results.  Her pain today is a 3/10 on a subjective scale.  She has tried taking Vioxx just as needed, but did not really notice any  significant improvement.  She also has continued to take Flexeril just as  needed.  She does not like taking any medications at all and wants to  explore other medications.  We had discussed a trial of acupuncture in the  past.  I reviewed the health and history form and 14 point review of  systems.   PHYSICAL EXAMINATION:  GENERAL:  Healthy female in no acute distress.  VITAL SIGNS:  Blood pressure 121/74, pulse 91, respirations 24, O2  saturation 98% on room air.  BACK:  Examination of the back reveals level pelvis without scoliosis.  There is a midline vertical incisional scar.  There is significant  tenderness to palpation in the right lumbosacral region.  This pain is  reproduced and increased with right rotation plus extension as well as to  some degree with flexion.  NEUROLOGIC:  Manual muscle testing is 5/5 bilateral  lower extremities.  Sensory examination is intact to light touch with the exception of a fuzzy  feeling in her right toes.  Muscle stretch reflexes are 1+/4 bilateral  patellar, medial hamstrings, left Achilles, and 0/4 right Achilles.   IMPRESSION:  Chronic low back pain so L5-S1 disk herniation and  microdiskectomy x2.  The patient's symptoms today are somewhat consistent  with possible facet joint mediated pain at the right L5-S1 facet joint and  possibly the right L4-5 contributing.   PLAN:  1. I had a long discussion with Elizabeth Rollins regarding further treatment     options.  Again, she does not like taking any medications and we are     searching for alternatives to  medications to help her pain.  At this     point I think it is reasonable to proceed with a diagnostic/therapeutic     right L5-S1 and possibly L4-5 facet joint injections.  We discussed this     at length including the risks, benefits, limitations, and alternatives.     The patient wishes to proceed in this regard.  2. If symptoms are not improving would consider a trial of acupuncture.  3. Continue Vioxx just as needed.  4. Get a copy of the functional capacity evaluation.  5. The patient is to return to clinic for facet joint injection.   The patient was educated on the above findings and recommendations and  understands.  No barriers to communication.                                               Sondra Come, D.O.    JJW/MEDQ  D:  07/17/2002  T:  07/18/2002  Job:  [id number]   cc:   Barnett Abu  [address]  [place], [place] [address]

## 2011-02-27 NOTE — Consult Note (Signed)
NAME:  Elizabeth Rollins, HIGHLEY                          ACCOUNT NO.:  [id number]   MEDICAL RECORD NO.:  [id number]                   PATIENT TYPE:  REC   LOCATION:  [facility]                                  FACILITY:  [facility]   PHYSICIAN:  Zachary George, DO                      DATE OF BIRTH:  1951/06/10   DATE OF CONSULTATION:  10/25/2002  DATE OF DISCHARGE:                                   CONSULTATION   HISTORY OF PRESENT ILLNESS:  The patient returns to clinic today for  reevaluation.  She was last seen on July 17, 2002.  In the interim I  received functional capacity evaluation which is located on the chart.  Recommendations were for a light physical demand level job.  I am in  agreement with the functional capacity evaluation recommendations.  The  patient seems to be doing very well in terms of her low back pain with  occasional flare-ups of her pain.  She continues on Vioxx 25 mg daily,  Flexeril 10 mg at bedtime as needed, and very rarely does she take Ultracet  for her pain.  Her pain today is a 2/10 on subjective scale.  Her  function  and quality of life indices seem to have improved overall.  I reviewed the  health and history form and 14-point review of systems.   PHYSICAL EXAMINATION:  GENERAL APPEARANCE:  A healthy-appearing female in no  acute distress.  VITAL SIGNS:  Blood pressure 127/81, pulse 113, respiratory rate 20, O2  saturation 98% on room air.  NEUROLOGIC:  Manual muscle testing is 5/5 bilateral lower extremities.  Sensory examination is intact to light touch bilateral lower extremities.  Muscle stretch reflexes are 1+/4 bilateral patellar and medial  hamstrings  and 0/4 bilateral Achilles today.   IMPRESSION:  1. Chronic low back pain.  2. Degenerative disc disease of lumbar spine, status post microdiscectomy x2     at L5-S1 for disc herniation.  3. Lumbar facet arthropathy.  I suspect the patient has a component of     lumbar facet mediated pain.  4. Maximal  medical improvement.   PLAN:  1. I discussed further treatment options with the patient. At this point, we     will continue with Vioxx 25 mg one p.o. q.d. #30 with two refills.  2. Flexeril 10 mg one p.o. q.h.s. as needed #30 with two refills.  3. Continue Ultracet as needed.  4. Consider lumbar facet injections diagnostically and therapeutically     although the patient declines at this time.  5. The patient is to follow up with her primary care physician.  She is at     maximal medical improvement and will be released from our care.   The patient was educated on the above findings and recommendations and  understands.  There were no barriers to communication.  Zachary George, DO    JW/MEDQ  D:  10/25/2002  T:  10/25/2002  Job:  [id number]   cc:   Belinda Block, M.D.  [address]  [address] [place], [place] [address]

## 2011-02-27 NOTE — Consult Note (Signed)
NAME:  Elizabeth Rollins, Elizabeth Rollins                          ACCOUNT NO.:  [id number]   MEDICAL RECORD NO.:  [id number]                   PATIENT TYPE:  REC   LOCATION:  [facility]                                  FACILITY:  [facility]   PHYSICIAN:  Sondra Come, D.O.                 DATE OF BIRTH:  [birth year]   DATE OF CONSULTATION:  05/15/2002  DATE OF DISCHARGE:                  PHYSICAL MEDICINE & REHABILITATION CONSULTATION   HISTORY OF PRESENT ILLNESS:  The patient return to clinic today as scheduled  for reevaluation.  She was initially seen on April 17, 2002.  Overall, she  continues to make improvements in her low back pain and flexibility  subjectively.  She currently rates her pain as a 2/10 on a subjective scale,  describing it as mild.  She was unable to tolerate the Vioxx secondary to  making her dizzy.  She also was unable to tolerate the Flexeril 10 mg at  bedtime, stating that it made her too sleepy, so she cut the dose in half,  but she does not like taking medications so she has discontinued.  She tried  the Lidoderm patches, but she states it made her back swell up.  She is not  currently taking Ultracet and only taking Motrin as needed.  She does  continue to take glucosamine chondroitin.  She would like to get off of all  medications if possible.  She denies any radicular symptoms at this time.  She continues with aquatic exercise program.  We discussed further treatment  options.  Her biggest complaint and problem is walking on cement forms.  I  reviewed health and history form and 14-point review of systems.   PHYSICAL EXAMINATION:  VITAL SIGNS:  Blood pressure 135/71, pulse 98,  respirations 16, O2 saturation 98%.  BACK:  Examination of the back reveals level pelvis without scoliosis.  There is a midline incisional scar.  There is mild tenderness to palpation  in the right lumbar paraspinals with a small knot noted over the PSIS on  the right.  Range of motion of the lumbar spine is  improving, without much  discomfort.  The patient continues to guard, however.  NEUROLOGIC:  Intact to motor, sensory, and reflexes.  EXTREMITIES:  The patient does have tight hamstrings and hip flexors on the  right compared to the left, with negative straight leg raise and Faber  maneuvers.  The patient has mildly tight heel cords with flat feet  bilaterally.   IMPRESSION:  Chronic low back pain, status post L5-S1 disk herniation and  microdiskectomy x 2, without radicular symptoms at this time.   PLAN:  1. I had a long discussion with the patient regarding treatment options.  I     recommend that she have a trial of acupuncture with Dr. Alinda Dooms to     assist with further pain in her low back.  She does not tolerate     medications very  well, and this is a non-medicine intervention.  2. We discussed a trial of over-the-counter foot orthoses for her pes     planus, which may allow her some ability to walk on hard surfaces and     decrease her low back pain.  3. Continue glucosamine chondroitin.  4. Continue Ultracet and ibuprofen as needed.  5. Continue aquatic exercise program.  6. Consider functional capacity evaluation as the patient is likely reaching     MMI.  7. Patient to return to clinic in two months for reevaluation, or sooner as     needed.   The patient was educated on the above findings and recommendations and  understands.  There were no barriers to communication.                                               Sondra Come, D.O.    JJW/MEDQ  D:  05/15/2002  T:  05/20/2002  Job:  [id number]   cc:   Ann Lions, M.D.  [address] [place], [place] [address]

## 2011-02-27 NOTE — Discharge Summary (Signed)
[place]. [facility]  Patient:    Elizabeth Rollins, Elizabeth Rollins Visit Number: [id number] MRN: [id number]          Service Type: SUR Location: 3000 3032 01 Attending Physician:  Danella Penton Dictated by:   Tanya Nones. Jeral Fruit, M.D. Admit Date:  11/09/2001 Discharge Date: 11/11/2001                             Discharge Summary  ADMISSION DIAGNOSIS:  Right L5-S1 herniated disk.  DISCHARGE DIAGNOSIS:  Right L5-S1 herniated disk.  PROCEDURE:  Right L5 hemilaminectomy with L4-5 foraminotomy, L5-S1 foraminotomy and diskectomy.  HISTORY OF PRESENT ILLNESS:  The patient was admitted because of recurrent pain down into the right leg.  The patient had failed all conservative treatment.  LABORATORY DATA:  Within normal limits.  HOSPITAL COURSE:  The patient was taken to surgery and a right L5 hemilaminectomy with right L5-S1 diskectomy, foraminotomy, and lysis of adhesions was done.  At the end, we had plenty of room for the L5 and S1 nerve roots.  Having done this, the patient is doing really well.  She has no pain, no weakness.  The wound looks nice.  She is afebrile and she has been ambulating.  She is being discharged today to be followed by me in my office.  CONDITION ON DISCHARGE:  Improved.  DISCHARGE MEDICATIONS:  Percocet and Xanax.  DIET:  Regular.  ACTIVITY:  Not to drive for at least 10 days.  FOLLOW-UP:  To be seen by me in three weeks. Dictated by:   Tanya Nones. Jeral Fruit, M.D. Attending Physician:  Danella Penton DD:  11/11/01 TD:  11/11/01 Job: [id number]
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
## 2011-02-27 NOTE — H&P (Signed)
[place]  Patient:    Elizabeth Rollins, Elizabeth Rollins Visit Number: [id number] MRN: [id number]          Service Type: SUR Location: 3000 [id number] Attending Physician:  Danella Penton Dictated by:   Tanya Nones. Jeral Fruit, M.D. Admit Date:  11/09/2001                           History and Physical  CHIEF COMPLAINT: This is a lady who last year underwent lumbar diskectomy. The patient did well but later on she started complaining of back pain with radiation to the right leg.  HISTORY OF PRESENT ILLNESS: The patient had physical therapy and conservative treatment but despite all that she was not any better.  The pain was going through her back and down to the right leg.  I saw her on severe occasions. We treated with medication.  Repeat MRI showed recurrent disk.  The insurance company sent her to have a second opinion by Dr. Shelle Iron, who concurred with the need for the patient to have re-exploration of the area.  We scheduled her at her request for early next month but now she called the office several days ago because the pain was getting worse and she was getting more weakness and numbness.  PAST MEDICAL HISTORY:  1. Lumbar diskectomy.  2. Hysterectomy.  3. Lump removed from right breast, which was benign.  ALLERGIES: SULFA.  SOCIAL HISTORY: The patient does not smoke and drinks socially.  FAMILY HISTORY: Mother died with high blood pressure and father died secondary to complications with diabetes.  REVIEW OF SYSTEMS: Positive for back pain and right leg pain.  PHYSICAL EXAMINATION:  GENERAL: The patient was seen in my office on several occasions, the last time on October 18, 2001.  She was having some pain down the right leg and was limping.  HEENT: Normal.  NECK: She is able to flex and lateralize with no problem.  LUNGS: Clear.  CARDIAC: Heart sounds normal.  ABDOMEN: Normal.  EXTREMITIES: Normal pulses.  NEUROLOGIC: Mental status  normal.  Cranial nerves normal.  Sensation normal except for some mild numbness in the top of the right foot.  She might have some tingling sensation of the outside of the right foot.  Reflexes symmetrical.  Strength 5/5 with some mild weakness of dorsiflexion and plantar flexion of the right foot.  MUSCULOSKELETAL: The patient has a midline scar.  She is able to flex but extension produces pain in the right leg.  LABORATORY DATA: X-rays show she has herniated disk at the level of 5-1 on the right side.  Flexion and extension of the lumbar spine showed no evidence of any spondylolisthesis.  PLAN: The patient is being admitted for surgery.  She knows about the risks such as CSF leak, infection, need for further surgery that might require fusion.  This was also fully explained by Dr. Shelle Iron. Dictated by:   Tanya Nones. Jeral Fruit, M.D. Attending Physician:  Danella Penton DD:  11/09/01 TD:  11/10/01 Job: [id number]

## 2011-02-27 NOTE — Op Note (Signed)
[facility]  Patient:    Elizabeth Rollins, Elizabeth Rollins Visit Number: [id number] MRN: [id number]          Service Type: GYN Location: 9300 [id number] Attending Physician:  Douglass Rivers Proc. Date: 12/09/00 Admit Date:  12/09/2000                             Operative Report  PREOPERATIVE DIAGNOSES:       1. Dysmenorrhea.                               2. Dyspareunia.                               3. Fibroid uterus.  POSTOPERATIVE DIAGNOSES:      1. Dysmenorrhea.                               2. Dyspareunia.                               3. Fibroid uterus.  PROCEDURE:                    Total abdominal hysterectomy with bilateral salpingo-oophorectomy.  SURGEON:                      Douglass Rivers, M.D.  ASSISTANT:                    Gaetano Hawthorne. Lily Peer, M.D.  ANESTHESIA:                   General.  ESTIMATED BLOOD LOSS:         250.  IF FLUIDS:                    2 L.  URINE OUTPUT:                 125 cc of clear urine.  FINDINGS:                     Uniformly globular uterus.  Atrophic ovaries. Normal cervix.  PATHOLOGY:                    Uterus, tubes, ovaries and cervix.  COMPLICATIONS:                None.  DESCRIPTION OF PROCEDURE:     The patient was taken to the operating room. General anesthesia was induced.  She was prepped and draped in the usual sterile fashion.  A Pfannenstiel skin incision was made with a scalpel and carried through the underlying layer of fascia with a second blade.  The fascia was scored in the midline.  The incision was extended laterally with Mayo scissors.  The inferior aspect of the fascial incision was gasped with Kochers.  The underlying rectus muscles were dissected off by blunt and sharp dissection.  In a similar fashion, the superior aspect of the fascia was elevated and the underlying rectus muscles were dissected off.  The rectus muscles were separated in the midline.  The peritoneum was identified  and entered by blunt dissection.  The peritoneal incision  was then extended superiorly and inferiorly with good visualization of the underlying bowel and bladder.  The pelvis was explored.  The uterus was noted to be uniformly enlarged, filling the cul-de-sac, but mobile.  An OConnor-OSullivan retractor was then placed in the incision and the bowel was packed away with moist lap packs x 3.  The adnexa were visualized and stabilized between two Kelly clamps.  The round ligament was elevated, transected and suture ligated with 0 Vicryl.  This was done bilaterally.  The anterior leaf of the broad ligament was incised and a bladder flap was created.  the posterior leaf was also incised bilaterally and entered bluntly.  The infundibulopelvic ligaments were clamped with a Heaney and transected.  A free tie and suture ligature of 0 Vicryl were then placed.  The uterine arteries were skeletonized bilaterally, transected and suture ligated x 2 with 0 Vicryl.  After we had secured the blood flow, the corpus of the uterus was sharply dissected off and passed off the field.  There was a small amount of bleeding noted from the angle on the left from the portion of the uterine arteries.  A suture ligature was placed.  It was noted to be hemostatic.  The ureter on this side was then identified.  Peristalsis was seen and noted to be free of the second ligature. The cardinal ligaments were then dissected off sharply and suture ligatures were placed.  This dissection was carried through into the uterosacral ligaments, which were also sharply dissected off and suture ligated.  The vagina, with careful attention to the bladder anteriorly, was sharply and bluntly dissected down.  The vagina was elevated and entered sharply.  The cervix was then sharply dissected off and passed off the field.  The vagina was stabilized.  The angles were everted and transected to the ipsilateral cardinal ligaments bilaterally.   The cuff was closed in a baseball-like fashion with 0 Vicryl in a running lock stitch.  The pelvis was then irrigated with a copious amount of warm saline.  Hemostasis was assured.  The ureter on the right was also dissected out and peristalsis was seen.  The lap packs and the retractor were then removed from the incision.  The muscles and the peritoneum were inspected and noted to be hemostatic.  The fascia was closed with 0 Vicryl starting at the apex and run to the midline bilaterally.  The subcu was irrigated.  The skin was closed with staples.  The patient tolerated the procedure well.  Sponge and needle counts were correct x 2.  She was given Ancef intraoperatively and transferred to the PACU in stable condition. Attending Physician:  Douglass Rivers DD:  12/09/00 TD:  12/09/00 Job: [id number]

## 2011-02-27 NOTE — Consult Note (Signed)
[facility]  Patient:    Elizabeth Rollins, HUM Visit Number: [id number] MRN: [id number]          Service Type: PMG Location: TPC Attending Physician:  Sondra Come Dictated by:   Sondra Come, D.O. Proc. Date: 04/17/02 Admit Date:  04/06/2002   CC:         Tanya Nones. Jeral Fruit, M.D.  [facility]; [facility] [address] [place], [place], [place] [address]   Consultation Report  Dear Dr. Jeral Fruit:  Thank you very much for kindly referring [facility].  Ms. Olberding was evaluated in our clinic today.  Please refer to the following for details regarding the history, physical examination, and treatment plan.  Once again, thank you for allowing [place] to participate in the care of Elizabeth Rollins.  CHIEF COMPLAINT:  Low back pain.  HISTORY OF PRESENT ILLNESS:  Elizabeth Rollins is a pleasant 60 year old right hand dominant female workers compensation patient who initially injured her back in October 2001 at work.  She was found to have an L5-S1 herniated nucleus pulposus with right-sided S1 radicular symptoms secondary to S1 nerve root encroachment.  She underwent L5-S1 microdiskectomy on Feb 24, 2000 per Dr. Jeral Fruit.  She initially did very well, but apparently had a disk reherniation at same level and underwent a repeat L5-S1 microdiskectomy on November 09, 2001.  She continues to have low back pain radiating primarily into her right hip with associated numbness and paraesthesias in the right lower extremity which have improved significantly, although she continues to have persistent numbness in her second through fifth toes and her heel and ankle.  She has been treated with various medications including Neurontin which she states helped but this was discontinued secondary to possibly causing her to lose weight.  Most of her low back pain is located on the right side, but occasionally as well on the left  lumbosacral region.  She describes her pain as a 4/10 on a subjective scale made worse with walking, bending, sitting, and physical therapy and improved with rest and ice.  Medications have also helped including Ultracet and Motrin but Motrin has caused her an upset stomach.  Her pain is described as constant, achy, sharp, burning with associated numbness and weakness.  Postoperatively she was referred to physical therapy, but had anxiety while performing the therapy in fear that she would reherniate her disk again.  She was started on Xanax at that time.  She has had a history of depressive symptoms and is not currently being treated with any antidepressant medications and has a history of trying Zoloft, Effexor, and Celexa in the past, but is somewhat intolerant of those medications.  Dr. Jeral Fruit had recommended aquatic therapy, although [name] states that workers compensation denied this.  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  She states she is more flexible and has a better overall outlook on her condition.  Her function and quality of life indexes have improved to some degree.  Her sleep still remains poor, but improved with Xanax, but wants to get off the Xanax secondary to not wanting to be on any medications, especially those that have potential for addiction. She has not tried other anti-inflammatory medications besides Motrin.  I reviewed the health and history form and 14 point review of systems.  She denies bowel and bladder dysfunction.  Denies fevers, chills, night sweats, weight loss.  PAST MEDICAL HISTORY:  Depression.  PAST SURGICAL HISTORY:  Lumbar microdiskectomy at L5-S1 x2, hysterectomy.  FAMILY HISTORY:  Heart disease, diabetes, hypertension.  SOCIAL HISTORY:  The patient denies smoking.  She admits to occasional alcohol use.  She is  married and not currently working.  She is a workers Therapist, music.  ALLERGIES:  SULFA.  MEDICATIONS: 1. Xanax at bedtime. 2. Ultracet as needed. 3. Motrin 600 mg b.i.d. with GI intolerance. 4. Glucosamine and chondroitin. 5. Premarin.  PHYSICAL EXAMINATION  GENERAL:  Healthy female in no acute distress.  VITAL SIGNS:  Blood pressure 134/84, pulse 87, respirations 16, O2 saturation 99% on room air.  BACK:  Examination of the back reveals level pelvis without scoliosis.  There is slightly decreased lumbar lordosis with a midline vertical incisional scar. Palpatory examination reveals tenderness to palpation at the right lumbar paraspinous region greater than the left.  Range of motion is full with minimal discomfort on flexion and extension, side bending, and rotation.  NEUROLOGIC:  Manual muscle testing is 5/5 bilateral lower extremities with the exception of 4+/5 right hip flexor and ankle plantar flexor strength.  Sensory examination reveals slightly decreased light touch in the right second through fifth toes as well as the heel and posterior ankle.  Muscle stretch reflexes are 1+/4 bilateral patellar, medial hamstrings, left Achilles, and 0/4 right Achilles.  Straight leg raise is negative bilaterally.  FABER is negative bilaterally.  The patient has slightly tight hamstrings and hip flexors.  EXTREMITIES:  No heat, erythema, or edema in the lower extremities.  IMPRESSION: 1. Chronic low back pain. 2. Degenerative disk disease of the lumbar spine. 3. Lumbar disk herniation at L5-S1, status post microdiskectomy x2 with mild    residual radicular symptoms, but improving.  PLAN: 1. I had a long discussion with Ms. Fester regarding further treatment    options.  At this point I agree with aquatic therapy for best outcome    measure. I agree with Ms. Picado with minimizing medication exposure.  I    will have her discontinue Motrin and begin Vioxx 25 mg daily as  needed #30     with one refill.  Will also begin Lidoderm patches 5% up to 12 hours per    day #30 with one refill. 2. Will have patient discontinue Xanax and to begin Flexeril 10 mg one p.o.    q.h.s. as needed #30 with one refill. 3. Continue glucosamine and chondroitin. 4. The patient may continue Ultracet just as needed. 5. Ultimately will begin weaning from the medications as her function improves    and her pain decreases. 6. I would recommend Tempur-Pedic mattress. 7. The patient is to follow up in four weeks for reevaluation. 8. The patient is to maintain contact with Dr. Jeral Fruit.  The patient was educated on the above findings and recommendations and understands.  No barriers to communication. Dictated by:   Sondra Come, D.O. Attending Physician:  Sondra Come DD:  04/17/02 TD:  04/19/02 Job: [id number]

## 2011-02-27 NOTE — Discharge Summary (Signed)
[facility]  Patient:    Elizabeth Rollins, Elizabeth Rollins                       MRN: [id number] Adm. Date:  [id number] Disc. Date: [id number] Attending:  Douglass Rivers Dictator:   Antony Contras, N.P.                           Discharge Summary  DISCHARGE DIAGNOSES:          Dysmenorrhea, dyspareunia, fibroid uterus.  PROCEDURE:                    Total abdominal hysterectomy, bilateral salpingo-oophorectomy.  HISTORY OF PRESENT ILLNESS:   Patient is a 60 year old para 0 postmenopausal female who is on hormone replacement in the form of Premphase who is complaining of severe dysmenorrhea and menorrhagia on hormone replacement.  In addition to having a fibroid uterus, she is complaining that her bleeding is quite heavy requiring her to change a pad frequently for five days of her cycle and her cramping has gotten worse and it is not uncommon for her to pass large clots.  She is not anemic.  She was offered several options including discontinuing her hormones, continue with hormone therapy, or change to a SERM with negative uterine effects, uterine artery embolization, and hysterectomy. Patient elected to proceed with definitive surgery.  HOSPITAL COURSE:              The patient was admitted December 09, 2000. Total abdominal hysterectomy, bilateral salpingo-oophorectomy was performed by Dr. Farrel Gobble assisted by Dr. Lily Peer under general anesthesia.  Findings included a uniformly globular uterus, atrophic ovaries, normal cervix. Pathology:  Normal cervix.  Postoperative course:  Patient remained afebrile. Had no difficulty with voiding or pain control and was able to be discharged in satisfactory condition on her third postoperative day.  LABORATORIES:                 CBC:  Hematocrit 29.5, hemoglobin 10.6, WBC 9.4, platelets 213,000.  DISPOSITION:                  Follow-up in the office in two weeks.  Continue on pain medication which was given preoperatively.DD:   01/03/01 TD:  01/04/01 Job: [id number]

## 2011-02-27 NOTE — Op Note (Signed)
   NAME:  Elizabeth Rollins, OHALLORAN                          ACCOUNT NO.:  [id number]   MEDICAL RECORD NO.:  [id number]                   PATIENT TYPE:  AMB   LOCATION:  ENDO                                 FACILITY:  [facility]   PHYSICIAN:  Danise Edge, M.D.                DATE OF BIRTH:  1951-08-26   DATE OF PROCEDURE:  07/05/2003  DATE OF DISCHARGE:                                 OPERATIVE REPORT   PROCEDURE PERFORMED:  Screening colonoscopy.   ENDOSCOPIST:  Charolett Bumpers, M.D.   INDICATIONS FOR PROCEDURE:  Ms. Kyiesha Millward is a 60 year old female born  02-Nov-1950.  Ms. Inman is scheduled to undergo her first screening  colonoscopy with polypectomy to prevent colon cancer.   PREMEDICATION:  Demerol 75 mg, Versed 10 mg.   DESCRIPTION OF PROCEDURE:  After obtaining informed consent, the patient was  placed in the left lateral decubitus position.  I administered intravenous  Demerol and intravenous Versed to achieve conscious sedation for the  procedure.  The patient's blood pressure, oxygen saturations and cardiac  rhythm were monitored throughout the procedure and documented in the medical  record.   Anal inspection was normal.  Digital rectal exam was normal.  The pediatric  Olympus adjustable colonoscope was introduced into the rectum and advanced  to the cecum.  Colonic preparation for the exam today was excellent.   Rectum:  Normal.   Sigmoid colon and descending colon:  Normal.   Splenic flexure:  Normal.   Transverse colon:  Normal.   Hepatic flexure:  Normal.   Ascending colon:  Normal.   Cecum and ileocecal valve:  Normal.   Ms. Kreiter has a few small diverticula in the transverse colon.    ASSESSMENT:  Normal screening proctocolonoscopy to the cecum.  No endoscopic  evidence for the presence of colorectal neoplasia.                                                  Danise Edge, M.D.    MJ/MEDQ  D:  07/05/2003  T:  07/06/2003  Job:  [id number]   cc:   Darius Bump, M.D.  [phone] [address]  [place] [address]  Fax: [phone]

## 2011-02-27 NOTE — H&P (Signed)
[place]  Patient:    Elizabeth, Rollins                       MRN: [id number] Adm. Date:  [id number] Attending:  Danella Penton                         History and Physical  CHIEF COMPLAINT: Elizabeth Rollins is a lady who was seen by me initially in my office on January 26, 2001 with a complaint of back and right leg pain.  HISTORY OF PRESENT ILLNESS: According to her, in February 2001 she developed back pain that went down to the right side of the back and she was out of work for five days.  She states she had physical therapy and later on she started to work.  Later on she started complaining of pain going to the right leg associated with cramp.  The patient was not getting any better.  The end of February 2002 she underwent abdominal hysterectomy and despite having morphine intravenously she was complaining of right leg pain all the way down to the right foot.  The patient had an MRI and was sent for evaluation.  At the beginning she was reluctant to go ahead with surgery but then she herself came to my office a few days ago telling me that the pain was unbearable and she wanted to go ahead with surgery.  She denies any pain in the left leg.  PAST MEDICAL/SURGICAL HISTORY:  1. Hysterectomy.  2. Lump removed from right breast.  ALLERGIES: SULFA.  SOCIAL HISTORY: She does not smoke.  She drinks socially.  FAMILY HISTORY: Mother died with high blood pressure.  Her father died secondary to complications of diabetes.  REVIEW OF SYSTEMS: Positive for UTI, back pain, and right leg weakness.  PHYSICAL EXAMINATION:  GENERAL: The patient came to my office limping from the right leg.  HEENT: Normal.  NECK: Normal.  LUNGS: Clear.  CARDIAC: Heart sounds normal.  ABDOMEN: Normal.  She has a scar on the lower abdomen secondary to the surgery she had in February 2002.  EXTREMITIES: Normal.  Normal pulses.  NEUROLOGIC: Mental status normal.   Cranial nerves normal.  Strength is 5/5 except in the right leg, where she has 3/5 weakness with plantar flexion and forward dorsiflexion.  Reflexes symmetric, with absent right ankle jerk. Straight leg raising on the right side positive at about 30 degrees and left side negative.  She has a positive sciatic notch, ______ on the right side. The MRI showed that, indeed, she had a large herniated disk at the level of 5-1 central and to the right.  CLINICAL IMPRESSION: Right L5-S1 herniated disk.  PLAN: The patient wants to go ahead with surgery because she is not any better.  The procedure was fully explained.  She knows about the risks such as infection, CSF leak, worsening of pain, paralysis and need for further surgery. DD:  02/22/01 TD:  02/23/01 Job: [id number]

## 2011-02-27 NOTE — H&P (Signed)
[facility]  Patient:    Elizabeth Rollins, Elizabeth Rollins                         MRN: [id number] Adm. Date:  12/09/00 Attending:  Douglass Rivers, M.D.                         History and Physical  DATE OF OPERATION:            December 09, 2000.  CHIEF COMPLAINT:              Menorrhagia and dysmenorrhea.  HISTORY OF PRESENT ILLNESS:   The patient is a 60 year old, P0, postmenopausal woman who is on hormone replacement in the form of Premphase, who complains of severe dysmenorrhea as well as menorrhagia on her hormone replacement.  The patient states that the bleeding is quite heavy, requiring her to change a pad frequently for five days of the cycle.  In addition, her cramping has gotten increasingly worse, and she states that it is not uncommon for her to pass large clots during her cycle.  Fortunately, she is not anemic and does not have dyspareunia.  The patient was found to have a multifibroid uterus.  PAST OB/GYN HISTORY:          Significant for menopause on hormone replacement.  History of normal Pap smears.  One pregnancy, no children. Monthly cycles.  Regular breast self-examinations.  Normal mammograms.  PAST MEDICAL HISTORY:         Negative.  PAST SURGICAL HISTORY:        1. Tubal ligation.                               2. Bilateral breast biopsies for benign disease.  FAMILY HISTORY:               Noncontributory.  Significant for hypertension, diabetes.  SOCIAL HISTORY:               Former smoker.  Occasional caffeine.  Regular exercise.  Occasional alcohol.  ALLERGIES:                    SULFA MEDICATIONS.  MEDICATIONS:                  Premphase and Flexeril.  PHYSICAL EXAMINATION:  GENERAL:                      Well-developed, well-nourished female in no acute distress.  HEENT:                        Unremarkable.  Thyroid is mid, nontender, and mobile.  HEART:                        Regular rate.  LUNGS:                        Clear  to auscultation.  BREASTS:                      Without mass, discharge, or retractions in supine and upright positions.  ABDOMEN:                      Soft and nontender.  Without  rebound or guarding.  PELVIC:                       Normal external female genitalia.  The BUS is negative.  The vagina is pink and moist.  The cervix is nulliparous.  The uterus is approximately 12 weeks in size, mobile, without distinct fibroids palpable.  The adnexa are not palpable.  RECTOVAGINAL:                 Possible uterine fibroids.  LABORATORY DATA:              The patient had an ultrasound which showed a uterus that measured 13.5 x 7.1 x 7.0 cm, but the endometrium is obscured by multiple fibroids, the largest of which was 7 x 6 cm.  The adnexa were visualized and unremarkable.  The remainder of the ultrasound was unremarkable.  ASSESSMENT:                   Multifibroid uterus with severe menorrhagia on hormone replacement in the absence of anemia.  The patient was offered several options.  One would be to discontinue her hormones.  The second would be to try to put her on continuous hormones in order to prevent bleeding.  The third would be to switch her to a serum with negative uterine effects.  Uterine artery embolization was also discussed with the patient, as well as a hysterectomy.  After the pros and cons of all of the above were discussed with the patient, she elects to have an abdominal hysterectomy in light of the fact that she also has low back pain, and it is possible that the fibroid uterus is contributing to this as well.  The risks and benefits of the procedure were discussed.  The risks of bleeding, infection, damage to underlying bowel, bladder, and ureters, the risk of incisional herniation, the risk of infection from transfusion, the risk of DVT were also discussed.  [facility] handout was reviewed.  The patient will present on December 09, 2000, for definitive surgery. DD:   12/07/00 TD:  12/07/00 Job: [id number]

## 2011-03-24 ENCOUNTER — Encounter: Payer: Self-pay | Admitting: Internal Medicine

## 2011-04-07 ENCOUNTER — Ambulatory Visit (INDEPENDENT_AMBULATORY_CARE_PROVIDER_SITE_OTHER): Payer: 59 | Admitting: Internal Medicine

## 2011-04-07 ENCOUNTER — Encounter: Payer: Self-pay | Admitting: Internal Medicine

## 2011-04-07 VITALS — BP 120/80 | HR 72 | Ht 66.5 in | Wt 136.0 lb

## 2011-04-07 DIAGNOSIS — M5020 Other cervical disc displacement, unspecified cervical region: Secondary | ICD-10-CM

## 2011-04-07 DIAGNOSIS — Z0000 Encounter for general adult medical examination without abnormal findings: Secondary | ICD-10-CM

## 2011-04-07 DIAGNOSIS — Z1211 Encounter for screening for malignant neoplasm of colon: Secondary | ICD-10-CM | POA: Insufficient documentation

## 2011-04-07 DIAGNOSIS — Z833 Family history of diabetes mellitus: Secondary | ICD-10-CM

## 2011-04-07 DIAGNOSIS — Z13220 Encounter for screening for lipoid disorders: Secondary | ICD-10-CM

## 2011-04-07 LAB — CBC WITH DIFFERENTIAL/PLATELET
Basophils Absolute: 0 10*3/uL (ref 0.0–0.1)
Eosinophils Absolute: 0 10*3/uL (ref 0.0–0.7)
Lymphocytes Relative: 34.4 % (ref 12.0–46.0)
MCHC: 34.2 g/dL (ref 30.0–36.0)
Monocytes Absolute: 0.3 10*3/uL (ref 0.1–1.0)
Neutrophils Relative %: 58.8 % (ref 43.0–77.0)
Platelets: 186 10*3/uL (ref 150.0–400.0)
RBC: 4.88 Mil/uL (ref 3.87–5.11)
RDW: 13.4 % (ref 11.5–14.6)

## 2011-04-07 LAB — POCT URINALYSIS DIPSTICK
Glucose, UA: NEGATIVE
Nitrite, UA: NEGATIVE
Protein, UA: NEGATIVE
Urobilinogen, UA: 0.2

## 2011-04-07 LAB — BASIC METABOLIC PANEL
BUN: 13 mg/dL (ref 6–23)
Calcium: 9.4 mg/dL (ref 8.4–10.5)
Creatinine, Ser: 0.8 mg/dL (ref 0.4–1.2)
GFR: 99.72 mL/min (ref >60.00)
Glucose, Bld: 89 mg/dL (ref 70–99)
Sodium: 145 mEq/L (ref 135–145)

## 2011-04-07 LAB — LIPID PANEL
HDL: 65.9 mg/dL (ref >39.00)
Total CHOL/HDL Ratio: 3
VLDL: 8.6 mg/dL (ref 0.0–40.0)

## 2011-04-07 LAB — HEPATIC FUNCTION PANEL
AST: 18 U/L (ref 0–37)
Albumin: 4.5 g/dL (ref 3.5–5.2)
Alkaline Phosphatase: 59 U/L (ref 39–117)
Bilirubin, Direct: 0.2 mg/dL (ref 0.0–0.3)
Total Protein: 7.2 g/dL (ref 6.0–8.3)

## 2011-04-07 LAB — HEMOGLOBIN A1C: Hgb A1c MFr Bld: 6 % (ref 4.6–6.5)

## 2011-04-07 MED ORDER — MELOXICAM 15 MG PO TABS: 15.0000 mg | ORAL_TABLET | Freq: Every day | ORAL | Status: DC

## 2011-04-07 NOTE — Progress Notes (Signed)
  Subjective:    Elizabeth Rollins is a 60 y.o. female who presents for a  CPX PV  See above    Activities of Daily Living  In your present state of health, do you have any difficulty performing the following activities?:  Preparing food and eating?: No Bathing yourself: No Getting dressed: No Using the toilet:No Moving around from place to place: No In the past year have you fallen or had a near fall?:No  Current exercise habits: Gym/ health club routine includes yoga and zumba and water aerobics.   Dietary issues discussed:see above  Depression Screen (Note: if answer to either of the following is "Yes", then a more complete depression screening is indicated)  Q1: Over the past two weeks, have you felt down, depressed or hopeless?no Q2: Over the past two weeks, have you felt little interest or pleasure in doing things? no    Objective:     Vision done by Dr. Rosary Lively in March 2012- normal Blood pressure 110/72, pulse 72, height 5' 6.5" (1.689 m), weight 136 lb (61.689 kg). Body mass index is 21.62 kg/(m^2).   Assessment:    Plan:

## 2011-04-07 NOTE — Patient Instructions (Addendum)
Continue lifestyle intervention healthy eating and exercise .  Will notify you  of labs when available. If every thing is ok then Check up in a year .

## 2011-04-07 NOTE — Progress Notes (Signed)
  Subjective:    Patient ID: Elizabeth Rollins, female    DOB: 21-Aug-1951, 60 y.o.   MRN: [id number]  HPI  Sleep 5-6 hours  Some hot flushes  And neck living with it.  Hhof 2 pet died 42 11 dog.   Water aerobic  And golden zumba and yoga.  Review of Systems ROS:  GEN/ HEENTNo fever, significant weight changes sweats headaches vision problems hearing changes, CV/ PULM; No chest pain shortness of breath cough, syncope,edema  change in exercise tolerance. GI /GU: No adominal pain, vomiting, change in bowel habits. No blood in the stool. No significant GU symptoms. SKIN/HEME: ,no acute skin rashes suspicious lesions or bleeding. No lymphadenopathy, nodules, masses.  NEURO/ PSYCH:  No  New neurologic signs  Has persistentpain and radiculopathy but not really weakness  No depression anxiety. But does have some sleep issues IMM/ Allergy: No unusual infections.  Allergy .   REST of 12 system review negative or as per hpi  Past history family history social history reviewed in the electronic medical record.       Objective:   Physical Exam Physical Exam: Vital signs reviewed [id number] is a well-developed well-nourished alert cooperative  aa female who appears her stated age in no acute distress.  HEENT: normocephalic  traumatic , Eyes: PERRL  Glasses EOM's full, some drift  conjunctiva clear, Nares: paten,t no deformity discharge or tenderness., Ears: no deformity EAC's clear TMs with normal landmarks. Mouth: clear OP, no lesions, edema.  Moist mucous membranes. Dentition in adequate repair. NECK: supple without masses, thyromegaly or bruits. CHEST/PULM:  Clear to auscultation and percussion breath sounds equal no wheeze , rales or rhonchi. No chest wall deformities or tenderness. Breast: normal by inspection . No dimpling, discharge, masses, tenderness or discharge . LN: no cervical axillary inguinal adenopathy CV: PMI is nondisplaced, S1 S2 no gallops, murmurs, rubs. Peripheral pulses are full  without delay.No JVD .  ABDOMEN: Bowel sounds normal nontender  No guard or rebound, no hepato splenomegal no CVA tenderness.  No hernia. Extremtities:  No clubbing cyanosis or edema, no acute joint swelling or redness no focal atrophy NEURO:  Oriented x3, cranial nerves 3-12 appear to be intact, no obvious focal weakness,gait within normal limits no abnormal reflexes or asymmetrical SKIN: No acute rashes normal turgor, color, no bruising or petechiae. PSYCH: Oriented, good eye contact, no obvious depression anxiety, cognition and judgment appear normal.      Assessment & Plan:  Preventive Health Care Counseled regarding healthy nutrition, exercise, sleep, injury prevention, calcium vit d and healthy weight . djd  No change coping  Screening  colon cancer  Family hx of diabetes in sibs and parents

## 2011-04-08 ENCOUNTER — Encounter: Payer: Self-pay | Admitting: *Deleted

## 2011-04-11 ENCOUNTER — Encounter: Payer: Self-pay | Admitting: Internal Medicine

## 2011-04-21 ENCOUNTER — Ambulatory Visit (INDEPENDENT_AMBULATORY_CARE_PROVIDER_SITE_OTHER): Payer: 59 | Admitting: Internal Medicine

## 2011-04-21 DIAGNOSIS — Z2911 Encounter for prophylactic immunotherapy for respiratory syncytial virus (RSV): Secondary | ICD-10-CM

## 2011-04-21 DIAGNOSIS — Z23 Encounter for immunization: Secondary | ICD-10-CM

## 2011-11-20 ENCOUNTER — Other Ambulatory Visit: Payer: Self-pay | Admitting: Internal Medicine

## 2011-11-20 DIAGNOSIS — Z1231 Encounter for screening mammogram for malignant neoplasm of breast: Secondary | ICD-10-CM

## 2011-12-15 ENCOUNTER — Ambulatory Visit: Payer: 59

## 2011-12-22 ENCOUNTER — Ambulatory Visit
Admission: RE | Admit: 2011-12-22 | Discharge: 2011-12-22 | Disposition: A | Discharge status: Home / Self Care | Payer: 59 | Source: Ambulatory Visit | Attending: Internal Medicine | Admitting: Internal Medicine

## 2011-12-22 DIAGNOSIS — Z1231 Encounter for screening mammogram for malignant neoplasm of breast: Secondary | ICD-10-CM

## 2012-01-27 ENCOUNTER — Ambulatory Visit (INDEPENDENT_AMBULATORY_CARE_PROVIDER_SITE_OTHER): Payer: 59 | Admitting: Women's Health

## 2012-01-27 ENCOUNTER — Encounter: Payer: Self-pay | Admitting: Women's Health

## 2012-01-27 ENCOUNTER — Other Ambulatory Visit (HOSPITAL_COMMUNITY)
Admission: RE | Admit: 2012-01-27 | Discharge: 2012-01-27 | Disposition: A | Discharge status: Home / Self Care | Payer: 59 | Source: Ambulatory Visit | Attending: Obstetrics and Gynecology | Admitting: Obstetrics and Gynecology

## 2012-01-27 VITALS — BP 116/74 | Ht 66.5 in | Wt 137.0 lb

## 2012-01-27 DIAGNOSIS — Z01419 Encounter for gynecological examination (general) (routine) without abnormal findings: Secondary | ICD-10-CM

## 2012-01-27 DIAGNOSIS — Z124 Encounter for screening for malignant neoplasm of cervix: Secondary | ICD-10-CM | POA: Insufficient documentation

## 2012-01-27 DIAGNOSIS — N898 Other specified noninflammatory disorders of vagina: Secondary | ICD-10-CM | POA: Diagnosis not present

## 2012-01-27 LAB — WET PREP FOR TRICH, YEAST, CLUE
Trich, Wet Prep: NONE SEEN
Yeast Wet Prep HPF POC: NONE SEEN

## 2012-01-27 MED ORDER — TERCONAZOLE 0.4 % VA CREA: 1.0000 | TOPICAL_CREAM | Freq: Every day | VAGINAL | Status: AC

## 2012-01-27 NOTE — Patient Instructions (Signed)

## 2012-01-27 NOTE — Progress Notes (Signed)
Zetha Kuhar [facility] [birth year] [id number]    History:    The patient presents for breast and pelvic exam. History of a TAH BSO in 2002 for fibroids. History of normal Paps and mammograms. Normal colonoscopy in 2002. Normal DEXA in 2011. Zostovac in 2012.   Past medical history, past surgical history, family history and social history were all reviewed and documented in the EPIC chart. On disability due to back problems. Artist.   ROS:  A  ROS was performed and pertinent positives and negatives are included in the history.  Exam:  Filed Vitals:   01/27/12 0827  BP: 116/74    General appearance:  Normal Head/Neck:  Normal, without cervical or supraclavicular adenopathy. Thyroid:  Symmetrical, normal in size, without palpable masses or nodularity. Respiratory  Effort:  Normal  Auscultation:  Clear without wheezing or rhonchi Cardiovascular  Auscultation:  Regular rate, without rubs, murmurs or gallops  Edema/varicosities:  Not grossly evident Abdominal  Soft,nontender, without masses, guarding or rebound.  Liver/spleen:  No organomegaly noted  Hernia:  None appreciated  Skin  Inspection:  Grossly normal  Palpation:  Grossly normal Neurologic/psychiatric  Orientation:  Normal with appropriate conversation.  Mood/affect:  Normal  Genitourinary    Breasts: Examined lying and sitting.     Right: Without masses, retractions, discharge or axillary adenopathy.     Left: Without masses, retractions, discharge or axillary adenopathy.   Inguinal/mons:  Normal without inguinal adenopathy  External genitalia:  Normal  BUS/Urethra/Skene's glands:  Normal  Bladder:  Normal  Vagina:  Normal  Cervix:  Absent  Uterus:  Absent  Adnexa/parametria:     Rt: Without masses or tenderness.   Lt: Without masses or tenderness.  Anus and perineum: Normal  Digital rectal exam: Normal sphincter tone without palpated masses or tenderness  Assessment/Plan:  61 y.o. M.BF G1 P0 for breast and pelvic   exam with complaint of occasional vaginal irritation with itching relieved with Monistat.  Normal postmenopausal exam on no ERT Primary care manages labs   Plan: Continue healthy lifestyle of water aerobics, low-impact exercise, vaginal lubricants with intercourse, prescription for Terazol 7 to use externally for vaginal itching as needed. Instructed to call if no relief. SBE's, annual mammogram, calcium rich diet, vitamin D 2000 daily encouraged. Will do home Hemoccult cards per primary care, recommended repeat screening colonoscopy this year.  DEXA next year.    Harrington Challenger [facility], 9:46 AM 01/27/2012

## 2012-01-28 LAB — URINALYSIS W MICROSCOPIC + REFLEX CULTURE
Bacteria, UA: NONE SEEN
Casts: NONE SEEN
Glucose, UA: NEGATIVE mg/dL
Hgb urine dipstick: NEGATIVE
Ketones, ur: NEGATIVE mg/dL
Leukocytes, UA: NEGATIVE
Nitrite: NEGATIVE
Protein, ur: NEGATIVE mg/dL

## 2012-06-28 ENCOUNTER — Ambulatory Visit: Payer: 59 | Admitting: Internal Medicine

## 2012-07-01 ENCOUNTER — Encounter: Payer: Self-pay | Admitting: Internal Medicine

## 2012-07-01 ENCOUNTER — Ambulatory Visit (INDEPENDENT_AMBULATORY_CARE_PROVIDER_SITE_OTHER): Payer: 59 | Admitting: Internal Medicine

## 2012-07-01 VITALS — BP 118/86 | HR 90 | Temp 98.6°F | Ht 66.5 in | Wt 139.0 lb

## 2012-07-01 DIAGNOSIS — Z833 Family history of diabetes mellitus: Secondary | ICD-10-CM

## 2012-07-01 DIAGNOSIS — Z1211 Encounter for screening for malignant neoplasm of colon: Secondary | ICD-10-CM

## 2012-07-01 DIAGNOSIS — Z0000 Encounter for general adult medical examination without abnormal findings: Secondary | ICD-10-CM

## 2012-07-01 DIAGNOSIS — Z973 Presence of spectacles and contact lenses: Secondary | ICD-10-CM

## 2012-07-01 DIAGNOSIS — M5020 Other cervical disc displacement, unspecified cervical region: Secondary | ICD-10-CM

## 2012-07-01 LAB — LIPID PANEL
Cholesterol: 200 mg/dL (ref 0–200)
LDL Cholesterol: 120 mg/dL — ABNORMAL HIGH (ref 0–99)
Total CHOL/HDL Ratio: 3
Triglycerides: 66 mg/dL (ref 0.0–149.0)
VLDL: 13.2 mg/dL (ref 0.0–40.0)

## 2012-07-01 LAB — CBC WITH DIFFERENTIAL/PLATELET
Basophils Absolute: 0 10*3/uL (ref 0.0–0.1)
Eosinophils Relative: 1.1 % (ref 0.0–5.0)
Lymphs Abs: 1.4 10*3/uL (ref 0.7–4.0)
Monocytes Absolute: 0.5 10*3/uL (ref 0.1–1.0)
Monocytes Relative: 8.6 % (ref 3.0–12.0)
Neutrophils Relative %: 65 % (ref 43.0–77.0)
Platelets: 195 10*3/uL (ref 150.0–400.0)
RDW: 13.4 % (ref 11.5–14.6)
WBC: 5.5 10*3/uL (ref 4.5–10.5)

## 2012-07-01 LAB — HEPATIC FUNCTION PANEL
ALT: 17 U/L (ref 0–35)
AST: 20 U/L (ref 0–37)
Bilirubin, Direct: 0.1 mg/dL (ref 0.0–0.3)
Total Bilirubin: 1.6 mg/dL — ABNORMAL HIGH (ref 0.3–1.2)

## 2012-07-01 LAB — POCT URINALYSIS DIPSTICK
Blood, UA: NEGATIVE
Ketones, UA: NEGATIVE
Protein, UA: NEGATIVE
Spec Grav, UA: 1.015
Urobilinogen, UA: 0.2
pH, UA: 7

## 2012-07-01 LAB — BASIC METABOLIC PANEL
BUN: 13 mg/dL (ref 6–23)
Creatinine, Ser: 0.9 mg/dL (ref 0.4–1.2)
GFR: 80.67 mL/min (ref >60.00)
Glucose, Bld: 109 mg/dL — ABNORMAL HIGH (ref 70–99)

## 2012-07-01 MED ORDER — MELOXICAM 15 MG PO TABS: 15.0000 mg | ORAL_TABLET | Freq: Every day | ORAL | Status: DC

## 2012-07-01 NOTE — Progress Notes (Signed)
Subjective:    Patient ID: Elizabeth Rollins, female    DOB: [birth year], 61 y.o.   MRN: [id number]  HPI Patient comes in today for preventive visit and follow-up of medical issues. Update  history since  last visit: No major changes injuries or new medications. She takes meloxicam when needed 15 mg but doesn't need to take it every day. She tries to stay physically active in regard to her back and leg symptoms. No progression of her degenerative disease as far she knows. Client a flu shot today states that she has a cold that she developed after her travel to the [facility] congestion no current fever or cough or severe pain. Review of Systems ROS:  GEN/ HEENT: No fever, significant weight changes sweats headaches  hearing changes, CV/ PULM; No chest pain shortness of breath cough, syncope,edema  change in exercise tolerance. GI /GU: No adominal pain, vomiting, change in bowel habits. No blood in the stool. No significant GU symptoms. SKIN/HEME: ,no acute skin rashes suspicious lesions or bleeding. No lymphadenopathy, nodules, masses.  NEURO/ PSYCH:  No new weakness  No depression anxiety. IMM/ Allergy: No unusual infections.  Allergy .   REST of 12 system review negative except as per HPI Past history family history social history reviewed in the electronic medical record.      Objective:   Physical Exam BP 118/86  Pulse 90  Temp 98.6 F (37 C) (Oral)  Ht 5' 6.5" (1.689 m)  Wt 139 lb (63.05 kg)  BMI 22.10 kg/m2  SpO2 96%  bp right 112/72 and left 104/70 pulses equal.  Without delay  Physical Exam: Vital signs reviewed JYN:[id number] is a well-developed well-nourished alert cooperative  aa  female who appears her stated age in no acute distress.  HEENT: normocephalic atraumatic , Eyes: PERRL glasses , conjunctiva clear, Nares: paten,t no deformity discharge or tenderness. Mild congestion, Ears: no deformity EAC's clear TMs with normal landmarks. Mouth: clear OP, no lesions, edema.  Moist  mucous membranes. Dentition in adequate repair. NECK: supple without masses, thyromegaly or bruits. CHEST/PULM:  Clear to auscultation and percussion breath sounds equal no wheeze , rales or rhonchi. No chest wall deformities or tenderness. Breast: normal by inspection . No dimpling, discharge, masses, tenderness or discharge . CV: PMI is nondisplaced, S1 S2 no gallops, murmurs, rubs. Peripheral pulses are full without delay.No JVD .  ABDOMEN: Bowel sounds normal nontender  No guard or rebound, no hepato splenomegal no CVA tenderness.   Extremtities:  No clubbing cyanosis or edema, no acute joint swelling or redness no focal atrophy NEURO:  Oriented x3, cranial nerves 3-12 appear to be intact, eoms drift on superior gaze  no obvious focal weakness,gait within normal limits SKIN: No acute rashes normal turgor, color, no bruising or petechiae. PSYCH: Oriented, good eye contact, no obvious depression anxiety, cognition and judgment appear normal. LN: no cervical axillary inguinal adenopathy    Assessment & Plan:   Preventive Health Care Counseled regarding healthy nutrition, exercise, sleep, injury prevention, calcium vit d and healthy weight . Mild uri  Uncomplicated wants to defer flu immuniz offered.  DJD  Med ok using prn  Risk benefit.  Continue activity.  Get her colonoscopy  Call if  Need re referral.  Labs today wants paper copy mailed   Is a discrepancy 10-15 mm in upper extremities however the pulses feel full and no delay. Do not think this is of significant consequence Family history of diabetes we'll check A1c today with other  labs

## 2012-07-01 NOTE — Patient Instructions (Signed)
Will notify you  of labs when available. Send copy to you in mail . Wellness visit in 1 year  conatct [place] if need help getting colonscopy done.

## 2012-12-12 ENCOUNTER — Other Ambulatory Visit: Payer: Self-pay

## 2012-12-12 DIAGNOSIS — Z1231 Encounter for screening mammogram for malignant neoplasm of breast: Secondary | ICD-10-CM

## 2012-12-21 ENCOUNTER — Telehealth: Payer: Self-pay | Admitting: Internal Medicine

## 2012-12-21 NOTE — Telephone Encounter (Signed)
Patient Information:  Caller Name: Elizabeth Rollins  Phone: [phone]  Patient: Elizabeth Rollins, Elizabeth Rollins  Gender: Female  DOB: 11/29/1950  Age: 62 Years  PCP: Berniece Andreas [facility])  Office Follow Up:  Does the office need to follow up with this patient?: Yes  Instructions For The Office: Would like an appt. in am, Thursday 3/13 to have her blood sugar evaluated.  Very concerned and going out of town Friday.   Symptoms  Reason For Call & Symptoms: Concerned that her blood sugar is elevated.  She was borderline in September.  Had dizziness yesterday and had to come home from work early.  Has lost weight since her visit.  Today, went to water aerobics and came home around 1330 and was slightly shaky.  After eating this afternoon the shakiness went away.  She is scheduled to go OOT early Friday am, 3/14 and wants her blood sugar evaluated prior to leaving.  Reviewed Health History In EMR: Yes  Reviewed Medications In EMR: Yes  Reviewed Allergies In EMR: Yes  Reviewed Surgeries / Procedures: Yes  Date of Onset of Symptoms: 12/20/2012  Guideline(s) Used:  Dizziness  Disposition Per Guideline:   See Today in Office  Reason For Disposition Reached:   Patient wants to be seen  Advice Given:  N/A

## 2012-12-22 ENCOUNTER — Encounter: Payer: Self-pay | Admitting: Internal Medicine

## 2012-12-22 ENCOUNTER — Ambulatory Visit (INDEPENDENT_AMBULATORY_CARE_PROVIDER_SITE_OTHER): Payer: 59 | Admitting: Internal Medicine

## 2012-12-22 VITALS — BP 110/78 | HR 68 | Temp 99.0°F | Wt 134.0 lb

## 2012-12-22 DIAGNOSIS — R259 Unspecified abnormal involuntary movements: Secondary | ICD-10-CM | POA: Diagnosis not present

## 2012-12-22 DIAGNOSIS — R251 Tremor, unspecified: Secondary | ICD-10-CM | POA: Insufficient documentation

## 2012-12-22 DIAGNOSIS — R739 Hyperglycemia, unspecified: Secondary | ICD-10-CM

## 2012-12-22 DIAGNOSIS — Z833 Family history of diabetes mellitus: Secondary | ICD-10-CM

## 2012-12-22 DIAGNOSIS — R7309 Other abnormal glucose: Secondary | ICD-10-CM

## 2012-12-22 LAB — GLUCOSE, POCT (MANUAL RESULT ENTRY): POC Glucose: 106 mg/dl — AB (ref 70–99)
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
## 2012-12-22 NOTE — Telephone Encounter (Signed)
Patient called 3/13 and was given 9:30 appt w/Dr. Fabian Sharp. Encounter closed.

## 2012-12-22 NOTE — Patient Instructions (Addendum)
Add more protein to the am meal without  Lots of calories. This does sound   Like.  a rebound sugar .  Nuts   Non fried egg 3 x per week is ok.   Also make sure you have a protein type snack    In mid day .     Will do nutrition  dietitcian referral in the meantime.  Get appointment for fasting labs with plenty of fluid for next week we will check your sugar A1c thyroid and chemistry and fasting insulin level.  If after above your symptoms do not resolve make a followup appointment.  If continuing symptoms we may order a glucose tolerance test.  Then plan followup. It is okay to delay your preventive visit and she wished to do and change the appointment time.

## 2012-12-29 ENCOUNTER — Other Ambulatory Visit (INDEPENDENT_AMBULATORY_CARE_PROVIDER_SITE_OTHER): Payer: 59

## 2012-12-29 DIAGNOSIS — R739 Hyperglycemia, unspecified: Secondary | ICD-10-CM

## 2012-12-29 DIAGNOSIS — R251 Tremor, unspecified: Secondary | ICD-10-CM

## 2012-12-29 DIAGNOSIS — R259 Unspecified abnormal involuntary movements: Secondary | ICD-10-CM

## 2012-12-29 DIAGNOSIS — Z833 Family history of diabetes mellitus: Secondary | ICD-10-CM

## 2012-12-29 DIAGNOSIS — R7309 Other abnormal glucose: Secondary | ICD-10-CM

## 2012-12-29 LAB — TSH: TSH: 1.92 u[IU]/mL (ref 0.35–5.50)

## 2012-12-29 LAB — CBC WITH DIFFERENTIAL/PLATELET
Eosinophils Relative: 0.9 % (ref 0.0–5.0)
HCT: 41.3 % (ref 36.0–46.0)
Lymphs Abs: 1.5 10*3/uL (ref 0.7–4.0)
Monocytes Relative: 6.7 % (ref 3.0–12.0)
Platelets: 173 10*3/uL (ref 150.0–400.0)
RBC: 4.97 Mil/uL (ref 3.87–5.11)
WBC: 3.9 10*3/uL — ABNORMAL LOW (ref 4.5–10.5)

## 2012-12-29 LAB — T4, FREE: Free T4: 0.96 ng/dL (ref 0.60–1.60)

## 2012-12-29 LAB — BASIC METABOLIC PANEL
BUN: 13 mg/dL (ref 6–23)
Chloride: 99 mEq/L (ref 96–112)
Creatinine, Ser: 0.8 mg/dL (ref 0.4–1.2)
GFR: 100.68 mL/min (ref >60.00)

## 2012-12-29 LAB — HEMOGLOBIN A1C: Hgb A1c MFr Bld: 5.6 % (ref 4.6–6.5)

## 2012-12-30 ENCOUNTER — Encounter: Payer: 59 | Attending: Internal Medicine | Admitting: Dietician

## 2012-12-30 ENCOUNTER — Encounter: Payer: Self-pay | Admitting: Dietician

## 2012-12-30 VITALS — Wt 134.0 lb

## 2012-12-30 DIAGNOSIS — Z713 Dietary counseling and surveillance: Secondary | ICD-10-CM | POA: Insufficient documentation

## 2012-12-30 DIAGNOSIS — R251 Tremor, unspecified: Secondary | ICD-10-CM

## 2012-12-30 DIAGNOSIS — R259 Unspecified abnormal involuntary movements: Secondary | ICD-10-CM | POA: Insufficient documentation

## 2012-12-30 DIAGNOSIS — Z833 Family history of diabetes mellitus: Secondary | ICD-10-CM

## 2012-12-30 LAB — INSULIN, FASTING: Insulin fasting, serum: 11 u[IU]/mL (ref 3–28)

## 2012-12-30 NOTE — Progress Notes (Signed)
Medical Nutrition Therapy:  Appt start time: 1000 end time:  1100.  Assessment:  Primary concerns today: maintaining wt, strength, Pre-DM.   MEDICATIONS: see list.   DIETARY INTAKE:  Usual eating pattern includes 2 meals and 1 snacks per day.  Everyday foods include nuts, oatmeal, fruits.  Avoided foods include dairy at this time, most salty snacks.    24-hr recall:  B ( AM): large bowl of oatmeal with blueberries, apples, or pears, with nuts, bananas. Green tea decaf plain. Snk ( AM): may grave on fruit. Has stopped choosing salty snacks like chips  L ( PM): tends to skip lunch Snk ( PM): none D ( PM): early dinner at 3 pm. Salmon, spinach, chick peas. chix with string beans and salad and potatoes. Mostly chix and fish as proteins. Sometimes pan fry fish (<1 per week). Lentil soup.  Snk ( PM): stovetop popcorn, almond butter. Lots of fruit and nuts.  Beverages: water, green tea, no alcohol.  Pt is very concerned about many types of foods affecting overall health and is seeking more clarification on DM and general health related to many foods.   Usual physical activity: water aerobics 3 days per week. 1 day per week Zumba. Walks in the community parks.   Progress Towards Goal(s):  In progress.   Nutritional Diagnosis:  NB-1.1 Food and nutrition-related knowledge deficit As related to eating pattern optimal for Pre-DM, poor clarity or misleading beliefs about certain foods based on media messages.  As evidenced by pt routine statements of concern over many foods, pt high CHO intake during breakfast and lunch skipping.    Intervention:  Nutrition counseling provided regarding pathophysiology of insulin resistance, development of hyper and hypoglycemia symptoms, and how to best control or prevent those symptoms. RD spent much of session emphasizing concept of moderation, even with foods considered healthful. RD instructed pt to reduce CHO content of breakfast by substituting in high  protein items. RD also spent good amount of time attempting to correct misconceptions about many foods and nutrition in general. Pt stated some concern about losing muscle mass, so RD provided a home workout to improve strength. RD also reinforced positive PA behaviors already in progress.   Handouts given during visit include:  Home Workout  Per pt request, RD provided some information to take home, listed below:   High Protein Foods: Dairy Meat/Poultry/Fish Nuts and Seeds Legumes (dried beans, chick peas, lentils)  High Fiber foods: Vegetables Most Fruit Whole Grains Legumes Many nuts and most seeds  Keys to control Blood Glucose: Include a protein, fiber, and fat with every meal. Try not to go longer than 4 hours without any food at all. Be sure to eat within 30 minutes after completion of exercise- include a protein food and some sugary food, like fruited yogurt or fruit and nuts. For General Health: Choose lean proteins. Red meats are OK, simply choose loin cuts over rib cuts, and trim fat when possible.  Include plant-based proteins like legumes, nuts, and seeds.  Eat 3 or more cups of vegetables and 2 or more pieces of fruit each day.  When choosing starches, choose whole grain versions such as whole corn, whole wheat bread, etc.  When eating starches and carbohydrates in general, try to ensure you don't eat too many in one sitting. For example, 1 cup of oatmeal and 1 piece of fruit is OK, but 3 of each is too much. Incorporate a high protein breakfast. Include eggs, dairy, or whatever is your  preference.  Monitoring/Evaluation:  Dietary intake, exercise, moderation, and body weight in 6 week(s).

## 2013-01-05 ENCOUNTER — Ambulatory Visit: Payer: 59 | Admitting: Internal Medicine

## 2013-01-17 ENCOUNTER — Ambulatory Visit
Admission: RE | Admit: 2013-01-17 | Discharge: 2013-01-17 | Disposition: A | Discharge status: Home / Self Care | Payer: Medicare Other | Source: Ambulatory Visit

## 2013-01-17 DIAGNOSIS — Z1231 Encounter for screening mammogram for malignant neoplasm of breast: Secondary | ICD-10-CM

## 2013-02-10 ENCOUNTER — Encounter: Payer: 59 | Attending: Internal Medicine | Admitting: Dietician

## 2013-02-10 DIAGNOSIS — R251 Tremor, unspecified: Secondary | ICD-10-CM

## 2013-02-10 DIAGNOSIS — Z0000 Encounter for general adult medical examination without abnormal findings: Secondary | ICD-10-CM

## 2013-02-10 DIAGNOSIS — R259 Unspecified abnormal involuntary movements: Secondary | ICD-10-CM | POA: Diagnosis not present

## 2013-02-10 DIAGNOSIS — Z713 Dietary counseling and surveillance: Secondary | ICD-10-CM | POA: Diagnosis not present

## 2013-02-10 DIAGNOSIS — Z833 Family history of diabetes mellitus: Secondary | ICD-10-CM | POA: Insufficient documentation

## 2013-02-10 NOTE — Progress Notes (Signed)
A:  Pt reports she has begun eating every 4 hours. She has also incorporated more protein foods, particularly eggs and greek yogurt. She has reported feeling much better, and claims she has had better complexion.   Pt still avoids all red meat, especially pork.   Pt has begun following the home workout plan provided, and has begun eating high protein food directly after completion of all exercise.   Pt has had no episodes of shaking, and has reported much more even feelings, believing she has had fewer fluctuations in BG.  Pt feels she has made a great deal of progress, and has made all changes requested at last appointment.  I: RD reinforced positive behaviors, continued to answer questions regarding efficacy of various different foods. RD advised timeline to check HgbA1c with PCP at regular intervals.  M/E: F/U prn. Monitor wt, steady intake, exercise habits.

## 2013-06-13 ENCOUNTER — Encounter: Payer: Self-pay | Admitting: Gastroenterology

## 2013-07-10 ENCOUNTER — Encounter: Payer: 59 | Admitting: Internal Medicine

## 2013-08-15 ENCOUNTER — Ambulatory Visit (AMBULATORY_SURGERY_CENTER): Payer: Self-pay | Admitting: *Deleted

## 2013-08-15 ENCOUNTER — Encounter: Payer: Self-pay | Admitting: Women's Health

## 2013-08-15 ENCOUNTER — Encounter: Payer: Self-pay | Admitting: Gastroenterology

## 2013-08-15 VITALS — Ht 67.0 in | Wt 138.8 lb

## 2013-08-15 DIAGNOSIS — Z1211 Encounter for screening for malignant neoplasm of colon: Secondary | ICD-10-CM

## 2013-08-15 MED ORDER — NA SULFATE-K SULFATE-MG SULF 17.5-3.13-1.6 GM/177ML PO SOLN: 1.0000 | Freq: Once | ORAL | Status: DC

## 2013-08-15 NOTE — Progress Notes (Signed)
No allergies to eggs or soy. No problems with anesthesia.  

## 2013-08-22 ENCOUNTER — Ambulatory Visit (INDEPENDENT_AMBULATORY_CARE_PROVIDER_SITE_OTHER): Payer: Medicare Other | Admitting: Women's Health

## 2013-08-22 ENCOUNTER — Encounter: Payer: Self-pay | Admitting: Women's Health

## 2013-08-22 VITALS — BP 108/70 | Ht 66.5 in | Wt 136.4 lb

## 2013-08-22 DIAGNOSIS — Z01419 Encounter for gynecological examination (general) (routine) without abnormal findings: Secondary | ICD-10-CM

## 2013-08-22 DIAGNOSIS — N952 Postmenopausal atrophic vaginitis: Secondary | ICD-10-CM | POA: Diagnosis not present

## 2013-08-22 NOTE — Progress Notes (Signed)
Elizabeth Rollins [place] Jan 19, 1951 [id number]    History:    The patient presents for annual exam.  TAH with BSO 2002 for fibroids on no HRT. Normal Pap and mammogram history.  Negative colonoscopy 2004 is scheduled for repeat next week. Zostavac 2012, DEXA 2011 normal/ second normal. Labs primary care.  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   ROS:  A  ROS was performed and pertinent positives and negatives are included in the history.  Exam:  Filed Vitals:   08/22/13 1015  BP: 108/70    General appearance:  Normal Head/Neck:  Normal, without cervical or supraclavicular adenopathy. Thyroid:  Symmetrical, normal in size, without palpable masses or nodularity. Respiratory  Effort:  Normal  Auscultation:  Clear without wheezing or rhonchi Cardiovascular  Auscultation:  Regular rate, without rubs, murmurs or gallops  Edema/varicosities:  Not grossly evident Abdominal  Soft,nontender, without masses, guarding or rebound.  Liver/spleen:  No organomegaly noted  Hernia:  None appreciated  Skin  Inspection:  Grossly normal  Palpation:  Grossly normal Neurologic/psychiatric  Orientation:  Normal with appropriate conversation.  Mood/affect:  Normal  Genitourinary    Breasts: Examined lying and sitting.     Right: Without masses, retractions, discharge or axillary adenopathy.     Left: Without masses, retractions, discharge or axillary adenopathy.   Inguinal/mons:  Normal without inguinal adenopathy  External genitalia:  Normal  BUS/Urethra/Skene's glands:  Normal  Bladder:  Normal  Vagina:  Normal  Cervix:  Absent Uterus absent  Adnexa/parametria:     Rt: Without masses or tenderness.   Lt: Without masses or tenderness.  Anus and perineum: Normal  Digital rectal exam: Normal sphincter tone without palpated masses or tenderness  Assessment/Plan:  62 y.o. MBF  G0 for annual exam with no complaints.  TAH/BSO 2002 fibroids no HRT  Plan: SBE's, continue annual mammogram, 3-D tomography reviewed and encouraged, history of dense breast. Paps normal, new screening guidelines reviewed. Home safety, fall prevention and importance of regular exercise reviewed. Vitamin D 2000 daily and calcium rich diet encouraged. Continue labs her primary care. Repeat DEXA next year.    Harrington Challenger WHNP, 1:22 PM 08/22/2013

## 2013-08-22 NOTE — Patient Instructions (Signed)
Health Recommendations for Postmenopausal Women Respected and ongoing research has looked at the most common causes of death, disability, and poor quality of life in postmenopausal women. The causes include heart disease, diseases of blood vessels, diabetes, depression, cancer, and bone loss (osteoporosis). Many things can be done to help lower the chances of developing these and other common problems: CARDIOVASCULAR DISEASE Heart Disease: A heart attack is a medical emergency. Know the signs and symptoms of a heart attack. Below are things women can do to reduce their risk for heart disease.   Do not smoke. If you smoke, quit.  Aim for a healthy weight. Being overweight causes many preventable deaths. Eat a healthy and balanced diet and drink an adequate amount of liquids.  Get moving. Make a commitment to be more physically active. Aim for 30 minutes of activity on most, if not all days of the week.  Eat for heart health. Choose a diet that is low in saturated fat and cholesterol and eliminate trans fat. Include whole grains, vegetables, and fruits. Read and understand the labels on food containers before buying.  Know your numbers. Ask your caregiver to check your blood pressure, cholesterol (total, HDL, LDL, triglycerides) and blood glucose. Work with your caregiver on improving your entire clinical picture.  High blood pressure. Limit or stop your table salt intake (try salt substitute and food seasonings). Avoid salty foods and drinks. Read labels on food containers before buying. Eating well and exercising can help control high blood pressure. STROKE  Stroke is a medical emergency. Stroke may be the result of a blood clot in a blood vessel in the brain or by a brain hemorrhage (bleeding). Know the signs and symptoms of a stroke. To lower the risk of developing a stroke:  Avoid fatty foods.  Quit smoking.  Control your diabetes, blood pressure, and irregular heart rate. THROMBOPHLEBITIS  (BLOOD CLOT) OF THE LEG  Becoming overweight and leading a stationary lifestyle may also contribute to developing blood clots. Controlling your diet and exercising will help lower the risk of developing blood clots. CANCER SCREENING  Breast Cancer: Take steps to reduce your risk of breast cancer.  You should practice "breast self-awareness." This means understanding the normal appearance and feel of your breasts and should include breast self-examination. Any changes detected, no matter how small, should be reported to your caregiver.  After age 40, you should have a clinical breast exam (CBE) every year.  Starting at age 40, you should consider having a mammogram (breast X-ray) every year.  If you have a family history of breast cancer, talk to your caregiver about genetic screening.  If you are at high risk for breast cancer, talk to your caregiver about having an MRI and a mammogram every year.  Intestinal or Stomach Cancer: Tests to consider are a rectal exam, fecal occult blood, sigmoidoscopy, and colonoscopy. Women who are high risk may need to be screened at an earlier age and more often.  Cervical Cancer:  Beginning at age 30, you should have a Pap test every 3 years as long as the past 3 Pap tests have been normal.  If you have had past treatment for cervical cancer or a condition that could lead to cancer, you need Pap tests and screening for cancer for at least 20 years after your treatment.  If you had a hysterectomy for a problem that was not cancer or a condition that could lead to cancer, then you no longer need Pap tests.    If you are between ages 65 and 70, and you have had normal Pap tests going back 10 years, you no longer need Pap tests.  If Pap tests have been discontinued, risk factors (such as a new sexual partner) need to be reassessed to determine if screening should be resumed.  Some medical problems can increase the chance of getting cervical cancer. In these  cases, your caregiver may recommend more frequent screening and Pap tests.  Uterine Cancer: If you have vaginal bleeding after reaching menopause, you should notify your caregiver.  Ovarian cancer: Other than yearly pelvic exams, there are no reliable tests available to screen for ovarian cancer at this time except for yearly pelvic exams.  Lung Cancer: Yearly chest X-rays can detect lung cancer and should be done on high risk women, such as cigarette smokers and women with chronic lung disease (emphysema).  Skin Cancer: A complete body skin exam should be done at your yearly examination. Avoid overexposure to the sun and ultraviolet light lamps. Use a strong sun block cream when in the sun. All of these things are important in lowering the risk of skin cancer. MENOPAUSE Menopause Symptoms: Hormone therapy products are effective for treating symptoms associated with menopause:  Moderate to severe hot flashes.  Night sweats.  Mood swings.  Headaches.  Tiredness.  Loss of sex drive.  Insomnia.  Other symptoms. Hormone replacement carries certain risks, especially in older women. Women who use or are thinking about using estrogen or estrogen with progestin treatments should discuss that with their caregiver. Your caregiver will help you understand the benefits and risks. The ideal dose of hormone replacement therapy is not known. The Food and Drug Administration (FDA) has concluded that hormone therapy should be used only at the lowest doses and for the shortest amount of time to reach treatment goals.  OSTEOPOROSIS Protecting Against Bone Loss and Preventing Fracture: If you use hormone therapy for prevention of bone loss (osteoporosis), the risks for bone loss must outweigh the risk of the therapy. Ask your caregiver about other medications known to be safe and effective for preventing bone loss and fractures. To guard against bone loss or fractures, the following is recommended:  If  you are less than age 50, take 1000 mg of calcium and at least 600 mg of Vitamin D per day.  If you are greater than age 50 but less than age 70, take 1200 mg of calcium and at least 600 mg of Vitamin D per day.  If you are greater than age 70, take 1200 mg of calcium and at least 800 mg of Vitamin D per day. Smoking and excessive alcohol intake increases the risk of osteoporosis. Eat foods rich in calcium and vitamin D and do weight bearing exercises several times a week as your caregiver suggests. DIABETES Diabetes Melitus: If you have Type I or Type 2 diabetes, you should keep your blood sugar under control with diet, exercise and recommended medication. Avoid too many sweets, starchy and fatty foods. Being overweight can make control more difficult. COGNITION AND MEMORY Cognition and Memory: Menopausal hormone therapy is not recommended for the prevention of cognitive disorders such as Alzheimer's disease or memory loss.  DEPRESSION  Depression may occur at any age, but is common in elderly women. The reasons may be because of physical, medical, social (loneliness), or financial problems and needs. If you are experiencing depression because of medical problems and control of symptoms, talk to your caregiver about this. Physical activity and   exercise may help with mood and sleep. Community and volunteer involvement may help your sense of value and worth. If you have depression and you feel that the problem is getting worse or becoming severe, talk to your caregiver about treatment options that are best for you. ACCIDENTS  Accidents are common and can be serious in the elderly woman. Prepare your house to prevent accidents. Eliminate throw rugs, place hand bars in the bath, shower and toilet areas. Avoid wearing high heeled shoes or walking on wet, snowy, and icy areas. Limit or stop driving if you have vision or hearing problems, or you feel you are unsteady with you movements and  reflexes. HEPATITIS C Hepatitis C is a type of viral infection affecting the liver. It is spread mainly through contact with blood from an infected person. It can be treated, but if left untreated, it can lead to severe liver damage over years. Many people who are infected do not know that the virus is in their blood. If you are a "baby-boomer", it is recommended that you have one screening test for Hepatitis C. IMMUNIZATIONS  Several immunizations are important to consider having during your senior years, including:   Tetanus, diptheria, and pertussis booster shot.  Influenza every year before the flu season begins.  Pneumonia vaccine.  Shingles vaccine.  Others as indicated based on your specific needs. Talk to your caregiver about these. Document Released: 11/20/2005 Document Revised: 09/14/2012 Document Reviewed: 07/16/2008 Exit[employer] Patient Information 2014 [employer].  

## 2013-08-29 ENCOUNTER — Ambulatory Visit (AMBULATORY_SURGERY_CENTER): Payer: 59 | Admitting: Gastroenterology

## 2013-08-29 ENCOUNTER — Encounter: Payer: Self-pay | Admitting: Gastroenterology

## 2013-08-29 VITALS — BP 108/64 | HR 63 | Temp 96.0°F | Resp 14 | Ht 67.0 in | Wt 138.0 lb

## 2013-08-29 DIAGNOSIS — Z1211 Encounter for screening for malignant neoplasm of colon: Secondary | ICD-10-CM

## 2013-08-29 DIAGNOSIS — K5730 Diverticulosis of large intestine without perforation or abscess without bleeding: Secondary | ICD-10-CM

## 2013-08-29 MED ORDER — SODIUM CHLORIDE 0.9 % IV SOLN: 500.0000 mL | INTRAVENOUS | Status: DC

## 2013-08-29 NOTE — Patient Instructions (Signed)
YOU HAD AN ENDOSCOPIC PROCEDURE TODAY AT THE [facility]: Refer to the procedure report that was given to you for any specific questions about what was found during the examination.  If the procedure report does not answer your questions, please call your gastroenterologist to clarify.  If you requested that your care partner not be given the details of your procedure findings, then the procedure report has been included in a sealed envelope for you to review at your convenience later.  YOU SHOULD EXPECT: Some feelings of bloating in the abdomen. Passage of more gas than usual.  Walking can help get rid of the air that was put into your GI tract during the procedure and reduce the bloating. If you had a lower endoscopy (such as a colonoscopy or flexible sigmoidoscopy) you may notice spotting of blood in your stool or on the toilet paper. If you underwent a bowel prep for your procedure, then you may not have a normal bowel movement for a few days.  DIET: Your first meal following the procedure should be a light meal and then it is ok to progress to your normal diet.  A half-sandwich or bowl of soup is an example of a good first meal.  Heavy or fried foods are harder to digest and may make you feel nauseous or bloated.  Likewise meals heavy in dairy and vegetables can cause extra gas to form and this can also increase the bloating.  Drink plenty of fluids but you should avoid alcoholic beverages for 24 hours.  ACTIVITY: Your care partner should take you home directly after the procedure.  You should plan to take it easy, moving slowly for the rest of the day.  You can resume normal activity the day after the procedure however you should NOT DRIVE or use heavy machinery for 24 hours (because of the sedation medicines used during the test).    SYMPTOMS TO REPORT IMMEDIATELY: A gastroenterologist can be reached at any hour.  During normal business hours, 8:30 AM to 5:00 PM Monday through Friday,  call [phone].  After hours and on weekends, please call the GI answering service at [phone] who will take a message and have the physician on call contact you.   Following lower endoscopy (colonoscopy or flexible sigmoidoscopy):  Excessive amounts of blood in the stool  Significant tenderness or worsening of abdominal pains  Swelling of the abdomen that is new, acute  Fever of 100F or higher  FOLLOW UP: If any biopsies were taken you will be contacted by phone or by letter within the next 1-3 weeks.  Call your gastroenterologist if you have not heard about the biopsies in 3 weeks.  Our staff will call the home number listed on your records the next business day following your procedure to check on you and address any questions or concerns that you may have at that time regarding the information given to you following your procedure. This is a courtesy call and so if there is no answer at the home number and we have not heard from you through the emergency physician on call, we will assume that you have returned to your regular daily activities without incident.  SIGNATURES/CONFIDENTIALITY: You and/or your care partner have signed paperwork which will be entered into your electronic medical record.  These signatures attest to the fact that that the information above on your After Visit Summary has been reviewed and is understood.  Full responsibility of the confidentiality of this   discharge information lies with you and/or your care-partner.  Recommendations See procedure report  

## 2013-08-29 NOTE — Progress Notes (Signed)
Patient did not have preoperative order for IV antibiotic SSI prophylaxis. (G8918)  Patient did not experience any of the following events: a burn prior to discharge; a fall within the facility; wrong site/side/patient/procedure/implant event; or a hospital transfer or hospital admission upon discharge from the facility. (G8907)  

## 2013-08-29 NOTE — Op Note (Signed)
[facility] [address] [place], [address]   COLONOSCOPY PROCEDURE REPORT  PATIENT: Elizabeth Rollins, Elizabeth Rollins  MR#: [id number] BIRTHDATE: Sep 05, 1951 , 62  yrs. old GENDER: Female ENDOSCOPIST: Louis Meckel, MD REFERRED XB:[id number] Lonie Peak, M.D. PROCEDURE DATE:  08/29/2013 PROCEDURE:   Colonoscopy, diagnostic First Screening Colonoscopy - Avg.  risk and is 50 yrs.  old or older Yes.  Prior Negative Screening - Now for repeat screening. N/A  History of Adenoma - Now for follow-up colonoscopy & has been > or = to 3 yrs.  N/A  Polyps Removed Today? No.  Recommend repeat exam, <10 yrs? No. ASA CLASS:   Class I INDICATIONS:Average risk patient for colon cancer. MEDICATIONS: MAC sedation, administered by CRNA and Propofol (Diprivan) 270 mg IV  DESCRIPTION OF PROCEDURE:   After the risks benefits and alternatives of the procedure were thoroughly explained, informed consent was obtained.  A digital rectal exam revealed no abnormalities of the rectum.   The LB [id number]  endoscope was introduced through the anus and advanced to the cecum, which was identified by both the appendix and ileocecal valve. No adverse events experienced.   The quality of the prep was excellent using Suprep  The instrument was then slowly withdrawn as the colon was fully examined.      COLON FINDINGS: Mild diverticulosis was noted in the transverse colon.   The colon was otherwise normal.  There was no diverticulosis, inflammation, polyps or cancers unless previously stated.  Retroflexed views revealed no abnormalities. The time to cecum=3 minutes 21 seconds.  Withdrawal time=7 minutes 18 seconds. The scope was withdrawn and the procedure completed. COMPLICATIONS: There were no complications.  ENDOSCOPIC IMPRESSION: 1.   Mild diverticulosis was noted in the transverse colon 2.   The colon was otherwise normal  RECOMMENDATIONS: Continue current colorectal screening recommendations for  "routine risk" patients with a repeat colonoscopy in 10 years.   eSigned:  Louis Meckel, MD 08/29/2013 9:16 AM   cc:   PATIENT NAME:  Elizabeth Rollins, Elizabeth Rollins MR#: [id number]

## 2013-08-29 NOTE — Progress Notes (Signed)
Report to pacu rn, vss, bbs=clear 

## 2013-08-30 ENCOUNTER — Telehealth: Payer: Self-pay

## 2013-08-30 NOTE — Telephone Encounter (Signed)
  Follow up Call-  Call back number 08/29/2013  Post procedure Call Back phone  # [phone]  Permission to leave phone message No     Patient questions:  Do you have a fever, pain , or abdominal swelling? no Pain Score  0 *  Have you tolerated food without any problems? yes  Have you been able to return to your normal activities? yes  Do you have any questions about your discharge instructions: Diet   no Medications  no Follow up visit  no  Do you have questions or concerns about your Care? no  Actions: * If pain score is 4 or above: No action needed, pain <4.  Per the pt, "thank you all, everyone was so professional". maw

## 2013-09-06 ENCOUNTER — Ambulatory Visit (INDEPENDENT_AMBULATORY_CARE_PROVIDER_SITE_OTHER): Payer: 59 | Admitting: Internal Medicine

## 2013-09-06 ENCOUNTER — Encounter: Payer: Self-pay | Admitting: Internal Medicine

## 2013-09-06 VITALS — BP 116/82 | HR 65 | Temp 97.6°F | Ht 66.5 in | Wt 136.0 lb

## 2013-09-06 DIAGNOSIS — Z23 Encounter for immunization: Secondary | ICD-10-CM | POA: Diagnosis not present

## 2013-09-06 DIAGNOSIS — Z0000 Encounter for general adult medical examination without abnormal findings: Secondary | ICD-10-CM

## 2013-09-06 DIAGNOSIS — Z833 Family history of diabetes mellitus: Secondary | ICD-10-CM

## 2013-09-06 LAB — BASIC METABOLIC PANEL
BUN: 15 mg/dL (ref 6–23)
CO2: 30 mEq/L (ref 19–32)
Calcium: 9.9 mg/dL (ref 8.4–10.5)
Chloride: 103 mEq/L (ref 96–112)
Glucose, Bld: 92 mg/dL (ref 70–99)
Sodium: 139 mEq/L (ref 135–145)

## 2013-09-06 LAB — CBC WITH DIFFERENTIAL/PLATELET
Basophils Absolute: 0 10*3/uL (ref 0.0–0.1)
Basophils Relative: 0.2 % (ref 0.0–3.0)
Eosinophils Absolute: 0.1 10*3/uL (ref 0.0–0.7)
Hemoglobin: 14.9 g/dL (ref 12.0–15.0)
Lymphs Abs: 1 10*3/uL (ref 0.7–4.0)
MCHC: 33.9 g/dL (ref 30.0–36.0)
MCV: 81.1 fl (ref 78.0–100.0)
Monocytes Absolute: 0.4 10*3/uL (ref 0.1–1.0)
Monocytes Relative: 6.1 % (ref 3.0–12.0)
Neutro Abs: 5.7 10*3/uL (ref 1.4–7.7)
RBC: 5.41 Mil/uL — ABNORMAL HIGH (ref 3.87–5.11)
RDW: 13.9 % (ref 11.5–14.6)
WBC: 7.2 10*3/uL (ref 4.5–10.5)

## 2013-09-06 LAB — POCT URINALYSIS DIP (MANUAL ENTRY)
Glucose, UA: NEGATIVE
Ketones, POC UA: NEGATIVE
Nitrite, UA: NEGATIVE
Protein Ur, POC: NEGATIVE
Spec Grav, UA: <=1.005
Urobilinogen, UA: 0.2

## 2013-09-06 LAB — HEPATIC FUNCTION PANEL
ALT: 14 U/L (ref 0–35)
Albumin: 4.3 g/dL (ref 3.5–5.2)
Total Protein: 7.7 g/dL (ref 6.0–8.3)

## 2013-09-06 LAB — LIPID PANEL
Cholesterol: 180 mg/dL (ref 0–200)
HDL: 65 mg/dL (ref >39.00)
Total CHOL/HDL Ratio: 3
Triglycerides: 55 mg/dL (ref 0.0–149.0)

## 2013-09-06 NOTE — Progress Notes (Signed)
Chief Complaint  Patient presents with  . Annual Exam    HPI: Patient comes in today for Preventive Health Care visit  No major change in health status since last visit . Saw nutritionist  Trying to prevent diabetes  No new sx  persistent  right lat foot numbness from back surgery does water aer zumba etc feels better with this.   ROS:  GEN/ HEENT: No fever, significant weight changes sweats headaches vision problems hearing changes, CV/ PULM; No chest pain shortness of breath cough, syncope,edema  change in exercise tolerance. GI /GU: No adominal pain, vomiting, change in bowel habits. No blood in the stool. No significant GU symptoms. SKIN/HEME: ,no acute skin rashes suspicious lesions or bleeding. No lymphadenopathy, nodules, masses.  NEURO/ PSYCH:  No neurologic signs such as weakness numbness. No depression anxiety. IMM/ Allergy: No unusual infections.  Allergy .   REST of 12 system review negative except as per HPI   Past Medical History  Diagnosis Date  . Miscarriage   . Varicella     hx as a child  . Enlarged uterus 2-02    TAH & BSO    Family History  Problem Relation Age of Onset  . Pulmonary embolism Mother     died 40  . Hypertension Mother   . Heart attack Father     died 57  . Diabetes Father   . Heart disease Father   . Allergies Sister   . Diabetes Brother   . Diabetes Brother   . Diabetes Sister   . Other Sister     Congestive Heart Failure  . Diabetes Sister   . Colon cancer Neg Hx     History   Social History  . Marital Status: Married    Spouse Name: N/A    Number of Children: N/A  . Years of Education: N/A   Social History Main Topics  . Smoking status: Former Smoker    Quit date: 10/12/1982  . Smokeless tobacco: Never Used  . Alcohol Use: No  . Drug Use: No  . Sexual Activity: Yes    Birth Control/ Protection: Surgical   Other Topics Concern  . 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 fish oil-omega-3 fatty acids 1000 MG capsule Take 2 g by mouth daily.    . Multiple Vitamin (CALCIUM COMPLEX PO) Take by mouth.  [name] VITAMIN D, CHOLECALCIFEROL, PO Take by mouth.   . [DISCONTINUED] calcium carbonate (OS-CAL) 600 MG TABS Take 600 mg by mouth 2 (two) times daily with a meal.    . [DISCONTINUED] meloxicam (MOBIC) 15 MG tablet Take 1 tablet (15 mg total) by mouth daily. Prn pain  . [DISCONTINUED] Specialty Vitamins Products (ONE-A-DAY BONE STRENGTH PO) Take by mouth daily.    EXAM:  BP 116/82  Pulse 65  Temp(Src) 97.6 F (36.4 C) (Oral)  Ht 5' 6.5" (1.689 m)  Wt 136 lb (61.689 kg)  BMI 21.62 kg/m2  SpO2 99%  Body mass index is 21.62 kg/(m^2).  Physical Exam: Vital signs reviewed [id number] is a well-developed well-nourished alert cooperative   female who appears her stated age in no acute distress.  HEENT: normocephalic atraumatic , Eyes: PERRL EOM's full, conjunctiva clear, Nares: paten,t no deformity discharge or tenderness., Ears: no deformity EAC's clear TMs  with normal landmarks. Mouth: clear OP, no lesions, edema.  Moist mucous membranes. Dentition in adequate repair. NECK: supple without masses, thyromegaly or bruits. CHEST/PULM:  Clear to auscultation and percussion breath sounds equal no wheeze , rales or rhonchi. No chest wall deformities or tenderness. Breast: normal by inspection . No dimpling, discharge, masses, tenderness or discharge . wellhealed back scars  CV: PMI is nondisplaced, S1 S2 no gallops, murmurs, rubs. Peripheral pulses are full without delay.No JVD .  ABDOMEN: Bowel sounds normal nontender  No guard or rebound, no hepato splenomegal no CVA tenderness.  No hernia. Extremtities:  No clubbing cyanosis or edema, no acute joint swelling or redness no focal atrophy NEURO:  Oriented x3, cranial nerves 3-12  appear to be intact, no obvious focal weakness,gait within normal limits no abnormal reflex dec dtr anchille on right toe heel strength normal  SKIN: No acute rashes normal turgor, color, no bruising or petechiae. PSYCH: Oriented, good eye contact, no obvious depression anxiety, cognition and judgment appear normal. LN: no cervical axillary inguinal adenopathy  Lab Results  Component Value Date   WBC 3.9* 12/29/2012   HGB 14.1 12/29/2012   HCT 41.3 12/29/2012   PLT 173.0 12/29/2012   GLUCOSE 102* 12/29/2012   CHOL 200 07/01/2012   TRIG 66.0 07/01/2012   HDL 66.90 07/01/2012   LDLCALC 120* 07/01/2012   ALT 17 07/01/2012   AST 20 07/01/2012   NA 138 12/29/2012   K 5.1 12/29/2012   CL 99 12/29/2012   CREATININE 0.8 12/29/2012   BUN 13 12/29/2012   CO2 30 12/29/2012   TSH 1.92 12/29/2012   HGBA1C 5.6 12/29/2012    ASSESSMENT AND PLAN:  Discussed the following assessment and plan:  Encounter for preventive health examination - updated has dens breast disc implications  - Plan: Multiple Vitamin (CALCIUM COMPLEX PO), Flu Vaccine QUAD 36+ mos PF IM (Fluarix), Basic metabolic panel, CBC with Differential, Hemoglobin A1c, Hepatic function panel, Lipid panel, TSH, POCT urinalysis dipstick, Hepatitis C antibody  Need for prophylactic vaccination and inoculation against influenza - Plan: Multiple Vitamin (CALCIUM COMPLEX PO), Flu Vaccine QUAD 36+ mos PF IM (Fluarix), Basic metabolic panel, CBC with Differential, Hemoglobin A1c, Hepatic function panel, Lipid panel, TSH, POCT urinalysis dipstick, Hepatitis C antibody  Family history of diabetes mellitus - Plan: Multiple Vitamin (CALCIUM COMPLEX PO), Flu Vaccine QUAD 36+ mos PF IM (Fluarix), Basic metabolic panel, CBC with Differential, Hemoglobin A1c, Hepatic function panel, Lipid panel, TSH, POCT urinalysis dipstick, Hepatitis C antibody  Patient Care Team: Madelin Headings, MD as PCP - General Harrington Challenger, NP as Nurse Practitioner (Obstetrics and  Gynecology) Patient Instructions  Continue lifestyle intervention healthy eating and exercise .  Will notify you  of labs when available. Will have [place] send letter with numbers. If good  wellnes visit in 1 year.  Preventive Care for Adults, Female A healthy lifestyle and preventive care can promote health and wellness. Preventive health guidelines for women include the following key practices.  A routine yearly physical is a good way to check with your caregiver about your health and preventive screening. It is a chance to share any concerns and updates on your health, and to receive a thorough exam.  Visit your dentist for a routine exam and preventive care every 6 months. Brush your teeth twice a day and floss once a day. Good oral hygiene prevents tooth decay and gum disease.  The frequency of eye exams is based on your age, health, family  medical history, use of contact lenses, and other factors. Follow your caregiver's recommendations for frequency of eye exams.  Eat a healthy diet. Foods like vegetables, fruits, whole grains, low-fat dairy products, and lean protein foods contain the nutrients you need without too many calories. Decrease your intake of foods high in solid fats, added sugars, and salt. Eat the right amount of calories for you.Get information about a proper diet from your caregiver, if necessary.  Regular physical exercise is one of the most important things you can do for your health. Most adults should get at least 150 minutes of moderate-intensity exercise (any activity that increases your heart rate and causes you to sweat) each week. In addition, most adults need muscle-strengthening exercises on 2 or more days a week.  Maintain a healthy weight. The body mass index (BMI) is a screening tool to identify possible weight problems. It provides an estimate of body fat based on height and weight. Your caregiver can help determine your BMI, and can help you achieve or  maintain a healthy weight.For adults 20 years and older:  A BMI below 18.5 is considered underweight.  A BMI of 18.5 to 24.9 is normal.  A BMI of 25 to 29.9 is considered overweight.  A BMI of 30 and above is considered obese.  Maintain normal blood lipids and cholesterol levels by exercising and minimizing your intake of saturated fat. Eat a balanced diet with plenty of fruit and vegetables. Blood tests for lipids and cholesterol should begin at age 81 and be repeated every 5 years. If your lipid or cholesterol levels are high, you are over 50, or you are at high risk for heart disease, you may need your cholesterol levels checked more frequently.Ongoing high lipid and cholesterol levels should be treated with medicines if diet and exercise are not effective.  If you smoke, find out from your caregiver how to quit. If you do not use tobacco, do not start.  Lung cancer screening is recommended for adults aged 72 80 years who are at high risk for developing lung cancer because of a history of smoking. Yearly low-dose computed tomography (CT) is recommended for people who have at least a 30-pack-year history of smoking and are a current smoker or have quit within the past 15 years. A pack year of smoking is smoking an average of 1 pack of cigarettes a day for 1 year (for example: 1 pack a day for 30 years or 2 packs a day for 15 years). Yearly screening should continue until the smoker has stopped smoking for at least 15 years. Yearly screening should also be stopped for people who develop a health problem that would prevent them from having lung cancer treatment.  If you are pregnant, do not drink alcohol. If you are breastfeeding, be very cautious about drinking alcohol. If you are not pregnant and choose to drink alcohol, do not exceed 1 drink per day. One drink is considered to be 12 ounces (355 mL) of beer, 5 ounces (148 mL) of wine, or 1.5 ounces (44 mL) of liquor.  Avoid use of street drugs.  Do not share needles with anyone. Ask for help if you need support or instructions about stopping the use of drugs.  High blood pressure causes heart disease and increases the risk of stroke. Your blood pressure should be checked at least every 1 to 2 years. Ongoing high blood pressure should be treated with medicines if weight loss and exercise are not effective.  If  you are 35 to 62 years old, ask your caregiver if you should take aspirin to prevent strokes.  Diabetes screening involves taking a blood sample to check your fasting blood sugar level. This should be done once every 3 years, after age 40, if you are within normal weight and without risk factors for diabetes. Testing should be considered at a younger age or be carried out more frequently if you are overweight and have at least 1 risk factor for diabetes.  Breast cancer screening is essential preventive care for women. You should practice "breast self-awareness." This means understanding the normal appearance and feel of your breasts and may include breast self-examination. Any changes detected, no matter how small, should be reported to a caregiver. Women in their 82s and 30s should have a clinical breast exam (CBE) by a caregiver as part of a regular health exam every 1 to 3 years. After age 80, women should have a CBE every year. Starting at age 42, women should consider having a mammography (breast X-ray test) every year. Women who have a family history of breast cancer should talk to their caregiver about genetic screening. Women at a high risk of breast cancer should talk to their caregivers about having magnetic resonance imaging (MRI) and a mammography every year.  Breast cancer gene (BRCA)-related cancer risk assessment is recommended for women who have family members with BRCA-related cancers. BRCA-related cancers include breast, ovarian, tubal, and peritoneal cancers. Having family members with these cancers may be associated with  an increased risk for harmful changes (mutations) in the breast cancer genes BRCA1 and BRCA2. Results of the assessment will determine the need for genetic counseling and BRCA1 and BRCA2 testing.  The Pap test is a screening test for cervical cancer. A Pap test can show cell changes on the cervix that might become cervical cancer if left untreated. A Pap test is a procedure in which cells are obtained and examined from the lower end of the uterus (cervix).  Women should have a Pap test starting at age 37.  Between ages 2 and 72, Pap tests should be repeated every 2 years.  Beginning at age 72, you should have a Pap test every 3 years as long as the past 3 Pap tests have been normal.  Some women have medical problems that increase the chance of getting cervical cancer. Talk to your caregiver about these problems. It is especially important to talk to your caregiver if a new problem develops soon after your last Pap test. In these cases, your caregiver may recommend more frequent screening and Pap tests.  The above recommendations are the same for women who have or have not gotten the vaccine for human papillomavirus (HPV).  If you had a hysterectomy for a problem that was not cancer or a condition that could lead to cancer, then you no longer need Pap tests. Even if you no longer need a Pap test, a regular exam is a good idea to make sure no other problems are starting.  If you are between ages 83 and 107, and you have had normal Pap tests going back 10 years, you no longer need Pap tests. Even if you no longer need a Pap test, a regular exam is a good idea to make sure no other problems are starting.  If you have had past treatment for cervical cancer or a condition that could lead to cancer, you need Pap tests and screening for cancer for at least 20 years after  your treatment.  If Pap tests have been discontinued, risk factors (such as a new sexual partner) need to be reassessed to determine if  screening should be resumed.  The HPV test is an additional test that may be used for cervical cancer screening. The HPV test looks for the virus that can cause the cell changes on the cervix. The cells collected during the Pap test can be tested for HPV. The HPV test could be used to screen women aged 33 years and older, and should be used in women of any age who have unclear Pap test results. After the age of 55, women should have HPV testing at the same frequency as a Pap test.  Colorectal cancer can be detected and often prevented. Most routine colorectal cancer screening begins at the age of 38 and continues through age 17. However, your caregiver may recommend screening at an earlier age if you have risk factors for colon cancer. On a yearly basis, your caregiver may provide home test kits to check for hidden blood in the stool. Use of a small camera at the end of a tube, to directly examine the colon (sigmoidoscopy or colonoscopy), can detect the earliest forms of colorectal cancer. Talk to your caregiver about this at age 64, when routine screening begins. Direct examination of the colon should be repeated every 5 to 10 years through age 59, unless early forms of pre-cancerous polyps or small growths are found.  Hepatitis C blood testing is recommended for all people born from [birth year] through 1965 and any individual with known risks for hepatitis C.  Practice safe sex. Use condoms and avoid high-risk sexual practices to reduce the spread of sexually transmitted infections (STIs). STIs include gonorrhea, chlamydia, syphilis, trichomonas, herpes, HPV, and human immunodeficiency virus (HIV). Herpes, HIV, and HPV are viral illnesses that have no cure. They can result in disability, cancer, and death. Sexually active women aged 44 and younger should be checked for chlamydia. Older women with new or multiple partners should also be tested for chlamydia. Testing for other STIs is recommended if you are  sexually active and at increased risk.  Osteoporosis is a disease in which the bones lose minerals and strength with aging. This can result in serious bone fractures. The risk of osteoporosis can be identified using a bone density scan. Women ages 44 and over and women at risk for fractures or osteoporosis should discuss screening with their caregivers. Ask your caregiver whether you should take a calcium supplement or vitamin D to reduce the rate of osteoporosis.  Menopause can be associated with physical symptoms and risks. Hormone replacement therapy is available to decrease symptoms and risks. You should talk to your caregiver about whether hormone replacement therapy is right for you.  Use sunscreen. Apply sunscreen liberally and repeatedly throughout the day. You should seek shade when your shadow is shorter than you. Protect yourself by wearing long sleeves, pants, a wide-brimmed hat, and sunglasses year round, whenever you are outdoors.  Once a month, do a whole body skin exam, using a mirror to look at the skin on your back. Notify your caregiver of new moles, moles that have irregular borders, moles that are larger than a pencil eraser, or moles that have changed in shape or color.  Stay current with required immunizations.  Influenza vaccine. All adults should be immunized every year.  Tetanus, diphtheria, and acellular pertussis (Td, Tdap) vaccine. Pregnant women should receive 1 dose of Tdap vaccine during each pregnancy. The  dose should be obtained regardless of the length of time since the last dose. Immunization is preferred during the 27th to 36th week of gestation. An adult who has not previously received Tdap or who does not know her vaccine status should receive 1 dose of Tdap. This initial dose should be followed by tetanus and diphtheria toxoids (Td) booster doses every 10 years. Adults with an unknown or incomplete history of completing a 3-dose immunization series with  Td-containing vaccines should begin or complete a primary immunization series including a Tdap dose. Adults should receive a Td booster every 10 years.  Varicella vaccine. An adult without evidence of immunity to varicella should receive 2 doses or a second dose if she has previously received 1 dose. Pregnant females who do not have evidence of immunity should receive the first dose after pregnancy. This first dose should be obtained before leaving the health care facility. The second dose should be obtained 4 8 weeks after the first dose.  Human papillomavirus (HPV) vaccine. Females aged 11 26 years who have not received the vaccine previously should obtain the 3-dose series. The vaccine is not recommended for use in pregnant females. However, pregnancy testing is not needed before receiving a dose. If a female is found to be pregnant after receiving a dose, no treatment is needed. In that case, the remaining doses should be delayed until after the pregnancy. Immunization is recommended for any person with an immunocompromised condition through the age of 26 years if she did not get any or all doses earlier. During the 3-dose series, the second dose should be obtained 4 8 weeks after the first dose. The third dose should be obtained 24 weeks after the first dose and 16 weeks after the second dose.  Zoster vaccine. One dose is recommended for adults aged 34 years or older unless certain conditions are present.  Measles, mumps, and rubella (MMR) vaccine. Adults born before 40 generally are considered immune to measles and mumps. Adults born in [birth year] or later should have 1 or more doses of MMR vaccine unless there is a contraindication to the vaccine or there is laboratory evidence of immunity to each of the three diseases. A routine second dose of MMR vaccine should be obtained at least 28 days after the first dose for students attending postsecondary schools, health care workers, or international travelers.  People who received inactivated measles vaccine or an unknown type of measles vaccine during 1963 1967 should receive 2 doses of MMR vaccine. People who received inactivated mumps vaccine or an unknown type of mumps vaccine before 1979 and are at high risk for mumps infection should consider immunization with 2 doses of MMR vaccine. For females of childbearing age, rubella immunity should be determined. If there is no evidence of immunity, females who are not pregnant should be vaccinated. If there is no evidence of immunity, females who are pregnant should delay immunization until after pregnancy. Unvaccinated health care workers born before [birth year] who lack laboratory evidence of measles, mumps, or rubella immunity or laboratory confirmation of disease should consider measles and mumps immunization with 2 doses of MMR vaccine or rubella immunization with 1 dose of MMR vaccine.  Pneumococcal 13-valent conjugate (PCV13) vaccine. When indicated, a person who is uncertain of her immunization history and has no record of immunization should receive the PCV13 vaccine. An adult aged 54 years or older who has certain medical conditions and has not been previously immunized should receive 1 dose of PCV13 vaccine. This  PCV13 should be followed with a dose of pneumococcal polysaccharide (PPSV23) vaccine. The PPSV23 vaccine dose should be obtained at least 8 weeks after the dose of PCV13 vaccine. An adult aged 4 years or older who has certain medical conditions and previously received 1 or more doses of PPSV23 vaccine should receive 1 dose of PCV13. The PCV13 vaccine dose should be obtained 1 or more years after the last PPSV23 vaccine dose.  Pneumococcal polysaccharide (PPSV23) vaccine. When PCV13 is also indicated, PCV13 should be obtained first. All adults aged 82 years and older should be immunized. An adult younger than age 36 years who has certain medical conditions should be immunized. Any person who resides in a  nursing home or long-term care facility should be immunized. An adult smoker should be immunized. People with an immunocompromised condition and certain other conditions should receive both PCV13 and PPSV23 vaccines. People with human immunodeficiency virus (HIV) infection should be immunized as soon as possible after diagnosis. Immunization during chemotherapy or radiation therapy should be avoided. Routine use of PPSV23 vaccine is not recommended for American Indians, [address], or people younger than 65 years unless there are medical conditions that require PPSV23 vaccine. When indicated, people who have unknown immunization and have no record of immunization should receive PPSV23 vaccine. One-time revaccination 5 years after the first dose of PPSV23 is recommended for people aged 44 64 years who have chronic kidney failure, nephrotic syndrome, asplenia, or immunocompromised conditions. People who received 1 2 doses of PPSV23 before age 76 years should receive another dose of PPSV23 vaccine at age 72 years or later if at least 5 years have passed since the previous dose. Doses of PPSV23 are not needed for people immunized with PPSV23 at or after age 62 years.  Meningococcal vaccine. Adults with asplenia or persistent complement component deficiencies should receive 2 doses of quadrivalent meningococcal conjugate (MenACWY-D) vaccine. The doses should be obtained at least 2 months apart. Microbiologists working with certain meningococcal bacteria, military recruits, people at risk during an outbreak, and people who travel to or live in countries with a high rate of meningitis should be immunized. A first-year college student up through age 91 years who is living in a residence hall should receive a dose if she did not receive a dose on or after her 16th birthday. Adults who have certain high-risk conditions should receive one or more doses of vaccine.  Hepatitis A vaccine. Adults who wish to be protected  from this disease, have certain high-risk conditions, work with hepatitis A-infected animals, work in hepatitis A research labs, or travel to or work in countries with a high rate of hepatitis A should be immunized. Adults who were previously unvaccinated and who anticipate close contact with an international adoptee during the first 60 days after arrival in the [place] from a country with a high rate of hepatitis A should be immunized.  Hepatitis B vaccine. Adults who wish to be protected from this disease, have certain high-risk conditions, may be exposed to blood or other infectious body fluids, are household contacts or sex partners of hepatitis B positive people, are clients or workers in certain care facilities, or travel to or work in countries with a high rate of hepatitis B should be immunized.  Haemophilus influenzae type b (Hib) vaccine. A previously unvaccinated person with asplenia or sickle cell disease or having a scheduled splenectomy should receive 1 dose of Hib vaccine. Regardless of previous immunization, a recipient of a hematopoietic stem  cell transplant should receive a 3-dose series 6 12 months after her successful transplant. Hib vaccine is not recommended for adults with HIV infection.     Neta Mends. Panosh M.D.  Health Maintenance  Topic Date Due  . Influenza Vaccine  05/12/2014  . Mammogram  01/18/2015  . Pap Smear  03/08/2015  . Tetanus/tdap  02/25/2020  . Colonoscopy  08/30/2023  . Zostavax  Completed   Health Maintenance Review   Pre visit review using our clinic review tool, if applicable. No additional management support is needed unless otherwise documented below in the visit note.

## 2013-09-06 NOTE — Patient Instructions (Signed)
Continue lifestyle intervention healthy eating and exercise .  Will notify you  of labs when available. Will have [place] send letter with numbers. If good  wellnes visit in 1 year.  Preventive Care for Adults, Female A healthy lifestyle and preventive care can promote health and wellness. Preventive health guidelines for women include the following key practices.  A routine yearly physical is a good way to check with your caregiver about your health and preventive screening. It is a chance to share any concerns and updates on your health, and to receive a thorough exam.  Visit your dentist for a routine exam and preventive care every 6 months. Brush your teeth twice a day and floss once a day. Good oral hygiene prevents tooth decay and gum disease.  The frequency of eye exams is based on your age, health, family medical history, use of contact lenses, and other factors. Follow your caregiver's recommendations for frequency of eye exams.  Eat a healthy diet. Foods like vegetables, fruits, whole grains, low-fat dairy products, and lean protein foods contain the nutrients you need without too many calories. Decrease your intake of foods high in solid fats, added sugars, and salt. Eat the right amount of calories for you.Get information about a proper diet from your caregiver, if necessary.  Regular physical exercise is one of the most important things you can do for your health. Most adults should get at least 150 minutes of moderate-intensity exercise (any activity that increases your heart rate and causes you to sweat) each week. In addition, most adults need muscle-strengthening exercises on 2 or more days a week.  Maintain a healthy weight. The body mass index (BMI) is a screening tool to identify possible weight problems. It provides an estimate of body fat based on height and weight. Your caregiver can help determine your BMI, and can help you achieve or maintain a healthy weight.For adults 20  years and older:  A BMI below 18.5 is considered underweight.  A BMI of 18.5 to 24.9 is normal.  A BMI of 25 to 29.9 is considered overweight.  A BMI of 30 and above is considered obese.  Maintain normal blood lipids and cholesterol levels by exercising and minimizing your intake of saturated fat. Eat a balanced diet with plenty of fruit and vegetables. Blood tests for lipids and cholesterol should begin at age 63 and be repeated every 5 years. If your lipid or cholesterol levels are high, you are over 50, or you are at high risk for heart disease, you may need your cholesterol levels checked more frequently.Ongoing high lipid and cholesterol levels should be treated with medicines if diet and exercise are not effective.  If you smoke, find out from your caregiver how to quit. If you do not use tobacco, do not start.  Lung cancer screening is recommended for adults aged 15 80 years who are at high risk for developing lung cancer because of a history of smoking. Yearly low-dose computed tomography (CT) is recommended for people who have at least a 30-pack-year history of smoking and are a current smoker or have quit within the past 15 years. A pack year of smoking is smoking an average of 1 pack of cigarettes a day for 1 year (for example: 1 pack a day for 30 years or 2 packs a day for 15 years). Yearly screening should continue until the smoker has stopped smoking for at least 15 years. Yearly screening should also be stopped for people who develop a  health problem that would prevent them from having lung cancer treatment.  If you are pregnant, do not drink alcohol. If you are breastfeeding, be very cautious about drinking alcohol. If you are not pregnant and choose to drink alcohol, do not exceed 1 drink per day. One drink is considered to be 12 ounces (355 mL) of beer, 5 ounces (148 mL) of wine, or 1.5 ounces (44 mL) of liquor.  Avoid use of street drugs. Do not share needles with anyone. Ask  for help if you need support or instructions about stopping the use of drugs.  High blood pressure causes heart disease and increases the risk of stroke. Your blood pressure should be checked at least every 1 to 2 years. Ongoing high blood pressure should be treated with medicines if weight loss and exercise are not effective.  If you are 39 to 62 years old, ask your caregiver if you should take aspirin to prevent strokes.  Diabetes screening involves taking a blood sample to check your fasting blood sugar level. This should be done once every 3 years, after age 36, if you are within normal weight and without risk factors for diabetes. Testing should be considered at a younger age or be carried out more frequently if you are overweight and have at least 1 risk factor for diabetes.  Breast cancer screening is essential preventive care for women. You should practice "breast self-awareness." This means understanding the normal appearance and feel of your breasts and may include breast self-examination. Any changes detected, no matter how small, should be reported to a caregiver. Women in their 2s and 30s should have a clinical breast exam (CBE) by a caregiver as part of a regular health exam every 1 to 3 years. After age 9, women should have a CBE every year. Starting at age 79, women should consider having a mammography (breast X-ray test) every year. Women who have a family history of breast cancer should talk to their caregiver about genetic screening. Women at a high risk of breast cancer should talk to their caregivers about having magnetic resonance imaging (MRI) and a mammography every year.  Breast cancer gene (BRCA)-related cancer risk assessment is recommended for women who have family members with BRCA-related cancers. BRCA-related cancers include breast, ovarian, tubal, and peritoneal cancers. Having family members with these cancers may be associated with an increased risk for harmful changes  (mutations) in the breast cancer genes BRCA1 and BRCA2. Results of the assessment will determine the need for genetic counseling and BRCA1 and BRCA2 testing.  The Pap test is a screening test for cervical cancer. A Pap test can show cell changes on the cervix that might become cervical cancer if left untreated. A Pap test is a procedure in which cells are obtained and examined from the lower end of the uterus (cervix).  Women should have a Pap test starting at age 76.  Between ages 64 and 82, Pap tests should be repeated every 2 years.  Beginning at age 57, you should have a Pap test every 3 years as long as the past 3 Pap tests have been normal.  Some women have medical problems that increase the chance of getting cervical cancer. Talk to your caregiver about these problems. It is especially important to talk to your caregiver if a new problem develops soon after your last Pap test. In these cases, your caregiver may recommend more frequent screening and Pap tests.  The above recommendations are the same for women who  have or have not gotten the vaccine for human papillomavirus (HPV).  If you had a hysterectomy for a problem that was not cancer or a condition that could lead to cancer, then you no longer need Pap tests. Even if you no longer need a Pap test, a regular exam is a good idea to make sure no other problems are starting.  If you are between ages 83 and 14, and you have had normal Pap tests going back 10 years, you no longer need Pap tests. Even if you no longer need a Pap test, a regular exam is a good idea to make sure no other problems are starting.  If you have had past treatment for cervical cancer or a condition that could lead to cancer, you need Pap tests and screening for cancer for at least 20 years after your treatment.  If Pap tests have been discontinued, risk factors (such as a new sexual partner) need to be reassessed to determine if screening should be resumed.  The  HPV test is an additional test that may be used for cervical cancer screening. The HPV test looks for the virus that can cause the cell changes on the cervix. The cells collected during the Pap test can be tested for HPV. The HPV test could be used to screen women aged 10 years and older, and should be used in women of any age who have unclear Pap test results. After the age of 65, women should have HPV testing at the same frequency as a Pap test.  Colorectal cancer can be detected and often prevented. Most routine colorectal cancer screening begins at the age of 34 and continues through age 39. However, your caregiver may recommend screening at an earlier age if you have risk factors for colon cancer. On a yearly basis, your caregiver may provide home test kits to check for hidden blood in the stool. Use of a small camera at the end of a tube, to directly examine the colon (sigmoidoscopy or colonoscopy), can detect the earliest forms of colorectal cancer. Talk to your caregiver about this at age 70, when routine screening begins. Direct examination of the colon should be repeated every 5 to 10 years through age 41, unless early forms of pre-cancerous polyps or small growths are found.  Hepatitis C blood testing is recommended for all people born from [birth year] through 1965 and any individual with known risks for hepatitis C.  Practice safe sex. Use condoms and avoid high-risk sexual practices to reduce the spread of sexually transmitted infections (STIs). STIs include gonorrhea, chlamydia, syphilis, trichomonas, herpes, HPV, and human immunodeficiency virus (HIV). Herpes, HIV, and HPV are viral illnesses that have no cure. They can result in disability, cancer, and death. Sexually active women aged 68 and younger should be checked for chlamydia. Older women with new or multiple partners should also be tested for chlamydia. Testing for other STIs is recommended if you are sexually active and at increased  risk.  Osteoporosis is a disease in which the bones lose minerals and strength with aging. This can result in serious bone fractures. The risk of osteoporosis can be identified using a bone density scan. Women ages 49 and over and women at risk for fractures or osteoporosis should discuss screening with their caregivers. Ask your caregiver whether you should take a calcium supplement or vitamin D to reduce the rate of osteoporosis.  Menopause can be associated with physical symptoms and risks. Hormone replacement therapy is available to decrease  symptoms and risks. You should talk to your caregiver about whether hormone replacement therapy is right for you.  Use sunscreen. Apply sunscreen liberally and repeatedly throughout the day. You should seek shade when your shadow is shorter than you. Protect yourself by wearing long sleeves, pants, a wide-brimmed hat, and sunglasses year round, whenever you are outdoors.  Once a month, do a whole body skin exam, using a mirror to look at the skin on your back. Notify your caregiver of new moles, moles that have irregular borders, moles that are larger than a pencil eraser, or moles that have changed in shape or color.  Stay current with required immunizations.  Influenza vaccine. All adults should be immunized every year.  Tetanus, diphtheria, and acellular pertussis (Td, Tdap) vaccine. Pregnant women should receive 1 dose of Tdap vaccine during each pregnancy. The dose should be obtained regardless of the length of time since the last dose. Immunization is preferred during the 27th to 36th week of gestation. An adult who has not previously received Tdap or who does not know her vaccine status should receive 1 dose of Tdap. This initial dose should be followed by tetanus and diphtheria toxoids (Td) booster doses every 10 years. Adults with an unknown or incomplete history of completing a 3-dose immunization series with Td-containing vaccines should begin or  complete a primary immunization series including a Tdap dose. Adults should receive a Td booster every 10 years.  Varicella vaccine. An adult without evidence of immunity to varicella should receive 2 doses or a second dose if she has previously received 1 dose. Pregnant females who do not have evidence of immunity should receive the first dose after pregnancy. This first dose should be obtained before leaving the health care facility. The second dose should be obtained 4 8 weeks after the first dose.  Human papillomavirus (HPV) vaccine. Females aged 49 26 years who have not received the vaccine previously should obtain the 3-dose series. The vaccine is not recommended for use in pregnant females. However, pregnancy testing is not needed before receiving a dose. If a female is found to be pregnant after receiving a dose, no treatment is needed. In that case, the remaining doses should be delayed until after the pregnancy. Immunization is recommended for any person with an immunocompromised condition through the age of 71 years if she did not get any or all doses earlier. During the 3-dose series, the second dose should be obtained 4 8 weeks after the first dose. The third dose should be obtained 24 weeks after the first dose and 16 weeks after the second dose.  Zoster vaccine. One dose is recommended for adults aged 64 years or older unless certain conditions are present.  Measles, mumps, and rubella (MMR) vaccine. Adults born before [birth year] generally are considered immune to measles and mumps. Adults born in [birth year] or later should have 1 or more doses of MMR vaccine unless there is a contraindication to the vaccine or there is laboratory evidence of immunity to each of the three diseases. A routine second dose of MMR vaccine should be obtained at least 28 days after the first dose for students attending postsecondary schools, health care workers, or international travelers. People who received inactivated  measles vaccine or an unknown type of measles vaccine during 1963 1967 should receive 2 doses of MMR vaccine. People who received inactivated mumps vaccine or an unknown type of mumps vaccine before 1979 and are at high risk for mumps infection should consider immunization  with 2 doses of MMR vaccine. For females of childbearing age, rubella immunity should be determined. If there is no evidence of immunity, females who are not pregnant should be vaccinated. If there is no evidence of immunity, females who are pregnant should delay immunization until after pregnancy. Unvaccinated health care workers born before [birth year] who lack laboratory evidence of measles, mumps, or rubella immunity or laboratory confirmation of disease should consider measles and mumps immunization with 2 doses of MMR vaccine or rubella immunization with 1 dose of MMR vaccine.  Pneumococcal 13-valent conjugate (PCV13) vaccine. When indicated, a person who is uncertain of her immunization history and has no record of immunization should receive the PCV13 vaccine. An adult aged 63 years or older who has certain medical conditions and has not been previously immunized should receive 1 dose of PCV13 vaccine. This PCV13 should be followed with a dose of pneumococcal polysaccharide (PPSV23) vaccine. The PPSV23 vaccine dose should be obtained at least 8 weeks after the dose of PCV13 vaccine. An adult aged 37 years or older who has certain medical conditions and previously received 1 or more doses of PPSV23 vaccine should receive 1 dose of PCV13. The PCV13 vaccine dose should be obtained 1 or more years after the last PPSV23 vaccine dose.  Pneumococcal polysaccharide (PPSV23) vaccine. When PCV13 is also indicated, PCV13 should be obtained first. All adults aged 11 years and older should be immunized. An adult younger than age 52 years who has certain medical conditions should be immunized. Any person who resides in a nursing home or long-term care  facility should be immunized. An adult smoker should be immunized. People with an immunocompromised condition and certain other conditions should receive both PCV13 and PPSV23 vaccines. People with human immunodeficiency virus (HIV) infection should be immunized as soon as possible after diagnosis. Immunization during chemotherapy or radiation therapy should be avoided. Routine use of PPSV23 vaccine is not recommended for American Indians, [place] Natives, or people younger than 65 years unless there are medical conditions that require PPSV23 vaccine. When indicated, people who have unknown immunization and have no record of immunization should receive PPSV23 vaccine. One-time revaccination 5 years after the first dose of PPSV23 is recommended for people aged 59 64 years who have chronic kidney failure, nephrotic syndrome, asplenia, or immunocompromised conditions. People who received 1 2 doses of PPSV23 before age 26 years should receive another dose of PPSV23 vaccine at age 80 years or later if at least 5 years have passed since the previous dose. Doses of PPSV23 are not needed for people immunized with PPSV23 at or after age 26 years.  Meningococcal vaccine. Adults with asplenia or persistent complement component deficiencies should receive 2 doses of quadrivalent meningococcal conjugate (MenACWY-D) vaccine. The doses should be obtained at least 2 months apart. Microbiologists working with certain meningococcal bacteria, [employer] recruits, people at risk during an outbreak, and people who travel to or live in countries with a high rate of meningitis should be immunized. A first-year college student up through age 85 years who is living in a residence hall should receive a dose if she did not receive a dose on or after her 16th birthday. Adults who have certain high-risk conditions should receive one or more doses of vaccine.  Hepatitis A vaccine. Adults who wish to be protected from this disease, have certain  high-risk conditions, work with hepatitis A-infected animals, work in hepatitis A research labs, or travel to or work in countries with a high rate of  hepatitis A should be immunized. Adults who were previously unvaccinated and who anticipate close contact with an international adoptee during the first 60 days after arrival in the [place] from a country with a high rate of hepatitis A should be immunized.  Hepatitis B vaccine. Adults who wish to be protected from this disease, have certain high-risk conditions, may be exposed to blood or other infectious body fluids, are household contacts or sex partners of hepatitis B positive people, are clients or workers in certain care facilities, or travel to or work in countries with a high rate of hepatitis B should be immunized.  Haemophilus influenzae type b (Hib) vaccine. A previously unvaccinated person with asplenia or sickle cell disease or having a scheduled splenectomy should receive 1 dose of Hib vaccine. Regardless of previous immunization, a recipient of a hematopoietic stem cell transplant should receive a 3-dose series 6 12 months after her successful transplant. Hib vaccine is not recommended for adults with HIV infection.

## 2013-09-07 LAB — HEPATITIS C ANTIBODY: HCV Ab: NEGATIVE

## 2013-09-15 ENCOUNTER — Telehealth: Payer: Self-pay | Admitting: Internal Medicine

## 2013-09-15 NOTE — Telephone Encounter (Signed)
Pt called back to inform you that she actually has been urinating frequently despite what she told you this morning and she would like something to be called to her [employer] on [address]. Please advise.

## 2013-09-15 NOTE — Telephone Encounter (Signed)
This patient will require an office visit to detect an leukocytes in her urine.  Only showed some abnormal cells.  Please call the pt and make an appt. WP is not here today.  She may be seen in [facility].

## 2013-09-18 ENCOUNTER — Ambulatory Visit (INDEPENDENT_AMBULATORY_CARE_PROVIDER_SITE_OTHER): Payer: Medicare Other | Admitting: Internal Medicine

## 2013-09-18 ENCOUNTER — Encounter: Payer: Self-pay | Admitting: Internal Medicine

## 2013-09-18 VITALS — BP 110/70 | HR 68 | Temp 97.7°F | Wt 137.0 lb

## 2013-09-18 DIAGNOSIS — R350 Frequency of micturition: Secondary | ICD-10-CM

## 2013-09-18 LAB — POCT URINALYSIS DIPSTICK
Bilirubin, UA: NEGATIVE
Blood, UA: NEGATIVE
Glucose, UA: NEGATIVE
Leukocytes, UA: NEGATIVE
Nitrite, UA: NEGATIVE
Spec Grav, UA: <=1.005
Urobilinogen, UA: 0.2
pH, UA: 6

## 2013-09-18 NOTE — Patient Instructions (Addendum)
Repeat urine is  Normal  Fu as needed

## 2013-09-18 NOTE — Progress Notes (Addendum)
Chief Complaint  Patient presents with  . Urinary Frequency    Feeling better    HPI: Patient comes in today to check her urine because it was told to be abnormal at her checkup. She thinks that she had psychological symptoms of urinary frequency feeling better at this time no burning back pain fever chills incontinence. Just wanted to make sure things were okay before she took off during the holidays also go over her lab tests. ROS: See pertinent positives and negatives per HPI.  Past Medical History  Diagnosis Date  . Miscarriage   . Varicella     hx as a child  . Enlarged uterus 2-02    TAH & BSO    Family History  Problem Relation Age of Onset  . Pulmonary embolism Mother     died 16  . Hypertension Mother   . Heart attack Father     died 37  . Diabetes Father   . Heart disease Father   . Allergies Sister   . Diabetes Brother   . Diabetes Brother   . Diabetes Sister   . Other Sister     Congestive Heart Failure  . Diabetes Sister   . Colon cancer Neg Hx     History   Social History  . Marital Status: Married    Spouse Name: N/A    Number of Children: N/A  . Years of Education: N/A   Social History Main Topics  . Smoking status: Former Smoker    Quit date: 10/12/1982  . Smokeless tobacco: Never Used  . Alcohol Use: No  . Drug Use: No  . Sexual Activity: Yes    Birth Control/ Protection: Surgical   Other Topics Concern  . 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 fish oil-omega-3 fatty acids 1000 MG capsule Take 2 g by mouth daily.    . Multiple Vitamin (CALCIUM COMPLEX PO) Take by mouth.  [name] VITAMIN D, CHOLECALCIFEROL, PO Take by mouth.     EXAM:  BP 110/70  Pulse 68  Temp(Src) 97.7 F (36.5 C) (Oral)  Wt 137 lb (62.143 kg)  SpO2 98%  Body mass index is  21.78 kg/(m^2).  GENERAL: vitals reviewed and listed above, alert, oriented, appears well hydrated and in no acute distress  PSYCH: pleasant and cooperative, no obvious depression or anxiety Urinalysis is clear today by dipstick Laboratory studies reviewed.l  Lab Results  Component Value Date   WBC 7.2 09/06/2013   HGB 14.9 09/06/2013   HCT 43.9 09/06/2013   PLT 164.0 09/06/2013   GLUCOSE 92 09/06/2013   CHOL 180 09/06/2013   TRIG 55.0 09/06/2013   HDL 65.00 09/06/2013   LDLCALC 104* 09/06/2013   ALT 14 09/06/2013   AST 18 09/06/2013   NA 139 09/06/2013   K 4.7 09/06/2013   CL 103 09/06/2013   CREATININE 0.7 09/06/2013   BUN 15 09/06/2013   CO2 30 09/06/2013   TSH 1.11 09/06/2013   HGBA1C 5.8 09/06/2013     ASSESSMENT AND PLAN:  Discussed the following assessment and plan:  Urinary frequency - Plan: POC Urinalysis Dipstick Reviewed above questions answered recheck when due or if  Recurrent luts -Patient advised to return or notify health care team  if symptoms worsen or persist or new concerns  arise.  Patient Instructions  Repeat urine is  Normal  Fu as needed      Burna Mortimer K. Annabella Elford M.D.   Pre visit review using our clinic review tool, if applicable. No additional management support is needed unless otherwise documented below in the visit note.

## 2013-11-22 ENCOUNTER — Encounter: Payer: Self-pay | Admitting: Internal Medicine

## 2013-11-22 ENCOUNTER — Ambulatory Visit (INDEPENDENT_AMBULATORY_CARE_PROVIDER_SITE_OTHER): Payer: 59 | Admitting: Internal Medicine

## 2013-11-22 VITALS — BP 126/86 | HR 67 | Temp 98.0°F | Ht 66.5 in | Wt 143.0 lb

## 2013-11-22 DIAGNOSIS — M5020 Other cervical disc displacement, unspecified cervical region: Secondary | ICD-10-CM

## 2013-11-22 DIAGNOSIS — M62838 Other muscle spasm: Secondary | ICD-10-CM | POA: Diagnosis not present

## 2013-11-22 MED ORDER — MELOXICAM 15 MG PO TABS
7.5000 mg | ORAL_TABLET | Freq: Every day | ORAL | Status: DC
Start: 2013-11-22 — End: 2014-05-06

## 2013-11-22 MED ORDER — METHOCARBAMOL 500 MG PO TABS: 500.0000 mg | ORAL_TABLET | Freq: Four times a day (QID) | ORAL | Status: DC | PRN

## 2013-11-22 MED ORDER — PREDNISONE 10 MG PO TABS: ORAL_TABLET | ORAL | Status: DC

## 2013-11-22 NOTE — Progress Notes (Signed)
Pre visit review using our clinic review tool, if applicable. No additional management support is needed unless otherwise documented below in the visit note.   Chief Complaint  Patient presents with  . Arm Pain    Ongoing for 9 days.  Has been taking left over meloxicam.  She says the pain has eased and is not a constant pain.  . Neck Pain  . Shoulder Pain    HPI: Patient comes in today for problem evaluation. Walked in to clinic yesterday ; comes in for appt today. Onset 9 days of persistent  Right neck scapular pain radiating to upper arm and occ tingling in fingers .took meloxicam 7.5- 15 mg still painful. Worse with reaching certain moves and deep breathing. Has had mris and evaluation in past  . This feels similar . Avoids medications( had se cns pain meds after surgery in past)   No fever chills new trauma   No . Other cp sob cough ROS: See pertinent positives and negatives per HPI.no  Past Medical History  Diagnosis Date  . Miscarriage   . Varicella     hx as a child  . Enlarged uterus 2-02    TAH & BSO    Family History  Problem Relation Age of Onset  . Pulmonary embolism Mother     died 46  . Hypertension Mother   . Heart attack Father     died 41  . Diabetes Father   . Heart disease Father   . Allergies Sister   . Diabetes Brother   . Diabetes Brother   . Diabetes Sister   . Other Sister     Congestive Heart Failure  . Diabetes Sister   . Colon cancer Neg Hx     History   Social History  . Marital Status: Married    Spouse Name: N/A    Number of Children: N/A  . Years of Education: N/A   Social History Main Topics  . Smoking status: Former Smoker    Quit date: 10/12/1982  . Smokeless tobacco: Never Used  . Alcohol Use: No  . Drug Use: No  . Sexual Activity: Yes    Birth Control/ Protection: Surgical   Other Topics Concern  . 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 fish oil-omega-3 fatty acids 1000 MG capsule Take 2 g by mouth daily.    . Multiple Vitamin (CALCIUM COMPLEX PO) Take by mouth.  [name] VITAMIN D, CHOLECALCIFEROL, PO Take by mouth.   . meloxicam (MOBIC) 15 MG tablet Take 0.5-1 tablets (7.5-15 mg total) by mouth daily.  . methocarbamol (ROBAXIN) 500 MG tablet Take 1 tablet (500 mg total) by mouth every 6 (six) hours as needed for muscle spasms.  . predniSONE (DELTASONE) 10 MG tablet Take pills per day,6,6,6,4,4,4,2,2,2,1,1,1    EXAM:  BP 126/86  Pulse 67  Temp(Src) 98 F (36.7 C) (Oral)  Ht 5' 6.5" (1.689 m)  Wt 143 lb (64.864 kg)  BMI 22.74 kg/m2  SpO2 97%  Body mass index is 22.74 kg/(m^2).  GENERAL: vitals reviewed and listed above, alert, oriented, appears well hydrated and in mild mod discomfort   But acute distress  HEENT: atraumatic, conjunctiva  clear, no obvious abnormalities on inspection of external nose and ears  NECK: no obvious masses  on inspection palpation  Tender mid lower c spin upper t spine  And some trapezial pain and near scapular  Mild m spasm   Ok rom with arm but uncomfortable  LUNGS: clear to auscultation bilaterally, no wheezes, rales or rhonchi, good air movement CV: HRRR, no clubbing cyanosis or  peripheral edema nl cap refill  MS: moves all extremities without noticeable focal  Abnormality no obv weakness  Color good  PSYCH: pleasant and cooperative, no obvious depression or anxiety  ASSESSMENT AND PLAN:  Discussed the following assessment and plan:  DEGENERATIVE DISC DISEASE, CERVICAL SPINE, W/RADICULOPATHY  Cervical paraspinal muscle spasm Flare of c spine disease pain    No Alarm  Features at this time   Cautious with med se  pred and muscl relaxant as needed If needed can refer to [facility] [place] as we discussed  Contact us if not improved or if wants to go with  referral. -Patient advised to return or notify health care team  if symptoms worsen or persist or new concerns arise.  Patient Instructions  i agree this is a pinched nerve  From your spine. Since didn't  Get better  With antiinflammatories .  Prednisone taper and muscle relaxant  As needed.  Contact us if needed  For PT referral.     Total visit 66mins > 50% spent counseling and coordinating care     Katy K. Panosh M.D. Last mri  IMPRESSION:  1. Mild degenerative changes in the thoracic spine, mainly at T2-3 and T3-4. There are bulging disks and mild foraminal encroachment bilaterally at these two levels.  2. Mild foraminal encroachment on the left at T4-5.  3. Shallow central disk protrusion at T7-8.  4. Normal MR appearance of the thoracic spinal cord.  Provider: Liston Alba 12 2007  1. Moderate sized broad-based disk protrusion at C5-6 with left foraminal component.  2. Paracentral disk protrusions bilaterally at C6-7 with medial foraminal encroachment bilaterally, right greater than left.  3. Minimal foraminal encroachment on the left at C4-5 and also at C3-4.  IMPRESSION:  2010  1. Slight progression of a broad-based disc osteophyte complex,  asymmetric to the right, at C5-6.  2. Broad-based disc osteophyte complex with mild central canal  narrowing at C6-7.  3. Left greater than right foraminal narrowing at C6-7 and C7-T1.  Mild left foraminal narrowing is present at to C3-4 and C5-6.  4. Mild prominence of soft tissues surrounding the dens. There  may be slight erosion of the tip of the dens. This can be seen in  the setting of rheumatoid arthritis. There is no canal compromise.

## 2013-11-22 NOTE — Patient Instructions (Signed)
i agree this is a pinched nerve  From your spine. Since didn't  Get better  With antiinflammatories .  Prednisone taper and muscle relaxant  As needed.  Contact us if needed  For PT referral.

## 2013-11-29 ENCOUNTER — Telehealth: Payer: Self-pay | Admitting: Internal Medicine

## 2013-11-29 DIAGNOSIS — M62838 Other muscle spasm: Secondary | ICD-10-CM

## 2013-11-29 DIAGNOSIS — M5020 Other cervical disc displacement, unspecified cervical region: Secondary | ICD-10-CM

## 2013-11-29 NOTE — Telephone Encounter (Signed)
"  Pt states the nerve in her neck is still being pinched and the pain is still radiating down right arm into hand"  Per pt  Pt instructed to call w/in 7 days if not better for poss physical therapy. Also pt almost end of prednisone, (5 days left)does dr Regis Bill think pt should do refill? Pharm: [facility]

## 2013-11-30 MED ORDER — TRAMADOL HCL 50 MG PO TABS: 50.0000 mg | ORAL_TABLET | Freq: Three times a day (TID) | ORAL | Status: DC | PRN

## 2013-11-30 NOTE — Telephone Encounter (Signed)
Please refer  To [facility] [place]  Uncertain if will help any  Can try plain pain pill ultram 50 tid as needed for pain  Disp 20 but this wont cure the problem.

## 2013-11-30 NOTE — Telephone Encounter (Signed)
Spoke to the pt and informed her that I sent rx to [facility] on [address] [facility] and that I have placed the referral.

## 2013-12-07 ENCOUNTER — Ambulatory Visit: Payer: 59 | Admitting: Physical Therapy

## 2013-12-11 ENCOUNTER — Ambulatory Visit: Payer: 59 | Attending: Internal Medicine | Admitting: Physical Therapy

## 2013-12-11 DIAGNOSIS — R5381 Other malaise: Secondary | ICD-10-CM | POA: Insufficient documentation

## 2013-12-11 DIAGNOSIS — M5020 Other cervical disc displacement, unspecified cervical region: Secondary | ICD-10-CM | POA: Insufficient documentation

## 2013-12-11 DIAGNOSIS — IMO0001 Reserved for inherently not codable concepts without codable children: Secondary | ICD-10-CM | POA: Insufficient documentation

## 2013-12-11 DIAGNOSIS — M25519 Pain in unspecified shoulder: Secondary | ICD-10-CM | POA: Insufficient documentation

## 2013-12-13 ENCOUNTER — Ambulatory Visit: Payer: 59 | Admitting: Physical Therapy

## 2013-12-15 ENCOUNTER — Ambulatory Visit: Payer: 59 | Admitting: Physical Therapy

## 2013-12-18 ENCOUNTER — Ambulatory Visit: Payer: 59 | Admitting: Physical Therapy

## 2013-12-20 ENCOUNTER — Ambulatory Visit: Payer: 59 | Admitting: Physical Therapy

## 2013-12-22 ENCOUNTER — Ambulatory Visit: Payer: 59 | Admitting: Physical Therapy

## 2013-12-25 ENCOUNTER — Ambulatory Visit: Payer: 59 | Admitting: Physical Therapy

## 2013-12-27 ENCOUNTER — Telehealth: Payer: Self-pay | Admitting: Internal Medicine

## 2013-12-27 ENCOUNTER — Ambulatory Visit: Payer: 59 | Admitting: Physical Therapy

## 2013-12-27 ENCOUNTER — Other Ambulatory Visit: Payer: Self-pay | Admitting: Family Medicine

## 2013-12-27 DIAGNOSIS — M5020 Other cervical disc displacement, unspecified cervical region: Secondary | ICD-10-CM

## 2013-12-27 NOTE — Telephone Encounter (Signed)
Patient has chose to proceed with neuro referral and MRI.  Orders placed in the system.

## 2013-12-27 NOTE — Telephone Encounter (Signed)
Pt would like to speak with you about her increased level of pain and would like to know what the next plan of care could be.  She would like either a referral to neurology or a MRI of neck and back??  Please call her back at the home or cell phone numbers listed.

## 2013-12-27 NOTE — Telephone Encounter (Signed)
i think she should have mri cervical spine and referral to neuro surgery   ( make sure no fever  Or new weakness in arm) I would be hapopy to see her in office if needed but sounds like we should proceed right away with above plan if she agrees.

## 2013-12-29 ENCOUNTER — Ambulatory Visit: Payer: 59 | Admitting: Physical Therapy

## 2014-01-01 ENCOUNTER — Telehealth: Payer: Self-pay | Admitting: Internal Medicine

## 2014-01-01 ENCOUNTER — Ambulatory Visit: Payer: 59 | Admitting: Physical Therapy

## 2014-01-01 NOTE — Telephone Encounter (Signed)
Pt states she wants a different neuro md.than what we referred to Pt wants ernesto botero @ [facility]  [phone] pls advise.

## 2014-01-03 ENCOUNTER — Encounter: Payer: 59 | Admitting: Physical Therapy

## 2014-01-03 ENCOUNTER — Other Ambulatory Visit: Payer: Self-pay | Admitting: Family Medicine

## 2014-01-03 DIAGNOSIS — M62838 Other muscle spasm: Secondary | ICD-10-CM

## 2014-01-03 DIAGNOSIS — M5020 Other cervical disc displacement, unspecified cervical region: Secondary | ICD-10-CM

## 2014-01-05 ENCOUNTER — Encounter: Payer: 59 | Admitting: Physical Therapy

## 2014-01-07 ENCOUNTER — Ambulatory Visit
Admission: RE | Admit: 2014-01-07 | Discharge: 2014-01-07 | Disposition: A | Discharge status: Home / Self Care | Payer: 59 | Source: Ambulatory Visit | Attending: Internal Medicine | Admitting: Internal Medicine

## 2014-01-07 DIAGNOSIS — M5020 Other cervical disc displacement, unspecified cervical region: Secondary | ICD-10-CM

## 2014-01-08 ENCOUNTER — Ambulatory Visit: Payer: 59 | Admitting: Physical Therapy

## 2014-01-09 DIAGNOSIS — M542 Cervicalgia: Secondary | ICD-10-CM | POA: Diagnosis not present

## 2014-01-09 DIAGNOSIS — M5412 Radiculopathy, cervical region: Secondary | ICD-10-CM | POA: Diagnosis not present

## 2014-01-10 ENCOUNTER — Encounter: Payer: 59 | Admitting: Physical Therapy

## 2014-01-14 ENCOUNTER — Other Ambulatory Visit: Payer: Self-pay | Admitting: Internal Medicine

## 2014-01-15 ENCOUNTER — Encounter: Payer: 59 | Admitting: Physical Therapy

## 2014-01-15 NOTE — Telephone Encounter (Signed)
Ok x 2  

## 2014-01-15 NOTE — Telephone Encounter (Signed)
Called the pharmacy to confirm she had no refills on file. Please advise.  Thanks!

## 2014-01-17 ENCOUNTER — Encounter: Payer: 59 | Admitting: Physical Therapy

## 2014-01-22 ENCOUNTER — Encounter: Payer: 59 | Admitting: Physical Therapy

## 2014-01-24 ENCOUNTER — Encounter: Payer: 59 | Admitting: Physical Therapy

## 2014-01-25 DIAGNOSIS — M5412 Radiculopathy, cervical region: Secondary | ICD-10-CM | POA: Diagnosis not present

## 2014-01-25 DIAGNOSIS — M47812 Spondylosis without myelopathy or radiculopathy, cervical region: Secondary | ICD-10-CM | POA: Diagnosis not present

## 2014-01-25 DIAGNOSIS — M542 Cervicalgia: Secondary | ICD-10-CM | POA: Diagnosis not present

## 2014-02-22 DIAGNOSIS — M542 Cervicalgia: Secondary | ICD-10-CM | POA: Diagnosis not present

## 2014-02-22 DIAGNOSIS — M5412 Radiculopathy, cervical region: Secondary | ICD-10-CM | POA: Diagnosis not present

## 2014-02-28 ENCOUNTER — Other Ambulatory Visit: Payer: Self-pay

## 2014-02-28 DIAGNOSIS — Z1231 Encounter for screening mammogram for malignant neoplasm of breast: Secondary | ICD-10-CM

## 2014-03-06 ENCOUNTER — Ambulatory Visit: Payer: 59

## 2014-03-13 ENCOUNTER — Encounter (INDEPENDENT_AMBULATORY_CARE_PROVIDER_SITE_OTHER): Payer: Self-pay

## 2014-03-13 ENCOUNTER — Ambulatory Visit
Admission: RE | Admit: 2014-03-13 | Discharge: 2014-03-13 | Disposition: A | Discharge status: Home / Self Care | Payer: 59 | Source: Ambulatory Visit

## 2014-03-13 DIAGNOSIS — Z1231 Encounter for screening mammogram for malignant neoplasm of breast: Secondary | ICD-10-CM

## 2014-03-22 DIAGNOSIS — M5412 Radiculopathy, cervical region: Secondary | ICD-10-CM | POA: Diagnosis not present

## 2014-03-22 DIAGNOSIS — M47812 Spondylosis without myelopathy or radiculopathy, cervical region: Secondary | ICD-10-CM | POA: Diagnosis not present

## 2014-03-22 DIAGNOSIS — M542 Cervicalgia: Secondary | ICD-10-CM | POA: Diagnosis not present

## 2014-03-22 DIAGNOSIS — M79609 Pain in unspecified limb: Secondary | ICD-10-CM | POA: Diagnosis not present

## 2014-05-06 ENCOUNTER — Other Ambulatory Visit: Payer: Self-pay | Admitting: Internal Medicine

## 2014-05-07 NOTE — Telephone Encounter (Signed)
Sent to the pharmacy by e-scribe. 

## 2014-05-07 NOTE — Telephone Encounter (Signed)
Ok to refill x 3  Due for lab monitoring in NOvember

## 2014-08-13 ENCOUNTER — Encounter: Payer: Self-pay | Admitting: Internal Medicine

## 2014-10-25 ENCOUNTER — Telehealth: Payer: Self-pay | Admitting: Internal Medicine

## 2014-10-25 NOTE — Telephone Encounter (Signed)
Pt has appt to see pcp 10/26/14 at 10:45

## 2014-10-25 NOTE — Telephone Encounter (Signed)
[facility]  Patient Name: Elizabeth Rollins  DOB: June 14, 1951    Nurse Assessment  Nurse: Loletta Specter, RN, Wells Guiles Date/Time (Eastern Time): 10/25/2014 2:47:21 PM  Confirm and document reason for call. If symptomatic, describe symptoms. ---Caller states dizzy and weak sunday and today.  Has the patient traveled out of the country within the last 30 days? ---Not Applicable  Does the patient require triage? ---Yes  Related visit to physician within the last 2 weeks? ---No  Does the PT have any chronic conditions? (i.e. diabetes, asthma, etc.) ---Unknown     Guidelines    Guideline Title Affirmed Question Affirmed Notes  Dizziness - Lightheadedness [1] MODERATE dizziness (e.g., interferes with normal activities) AND [2] has NOT been evaluated by physician for this (Exception: dizziness caused by heat exposure, sudden standing, or poor fluid intake)    Final Disposition User   See Physician within [place], Therapist, sports, [employer]

## 2014-10-26 ENCOUNTER — Encounter: Payer: Self-pay | Admitting: Internal Medicine

## 2014-10-26 ENCOUNTER — Ambulatory Visit (INDEPENDENT_AMBULATORY_CARE_PROVIDER_SITE_OTHER): Payer: 59 | Admitting: Internal Medicine

## 2014-10-26 VITALS — BP 130/94 | Temp 98.0°F | Wt 139.0 lb

## 2014-10-26 DIAGNOSIS — M5020 Other cervical disc displacement, unspecified cervical region: Secondary | ICD-10-CM | POA: Diagnosis not present

## 2014-10-26 DIAGNOSIS — Z833 Family history of diabetes mellitus: Secondary | ICD-10-CM | POA: Diagnosis not present

## 2014-10-26 DIAGNOSIS — Z79899 Other long term (current) drug therapy: Secondary | ICD-10-CM | POA: Diagnosis not present

## 2014-10-26 DIAGNOSIS — R42 Dizziness and giddiness: Secondary | ICD-10-CM | POA: Diagnosis not present

## 2014-10-26 DIAGNOSIS — R5383 Other fatigue: Secondary | ICD-10-CM | POA: Diagnosis not present

## 2014-10-26 LAB — BASIC METABOLIC PANEL
BUN: 19 mg/dL (ref 6–23)
CHLORIDE: 103 meq/L (ref 96–112)
CO2: 31 mEq/L (ref 19–32)
CREATININE: 0.77 mg/dL (ref 0.40–1.20)
Calcium: 10.2 mg/dL (ref 8.4–10.5)
GFR: 97.09 mL/min (ref >60.00)
GLUCOSE: 98 mg/dL (ref 70–99)
Potassium: 5.3 mEq/L — ABNORMAL HIGH (ref 3.5–5.1)
Sodium: 140 mEq/L (ref 135–145)

## 2014-10-26 LAB — CBC WITH DIFFERENTIAL/PLATELET
BASOS PCT: 0.4 % (ref 0.0–3.0)
Basophils Absolute: 0 10*3/uL (ref 0.0–0.1)
EOS ABS: 0.1 10*3/uL (ref 0.0–0.7)
EOS PCT: 1 % (ref 0.0–5.0)
HEMATOCRIT: 44.4 % (ref 36.0–46.0)
Hemoglobin: 14.7 g/dL (ref 12.0–15.0)
LYMPHS ABS: 1.9 10*3/uL (ref 0.7–4.0)
Lymphocytes Relative: 31.1 % (ref 12.0–46.0)
MCHC: 33.2 g/dL (ref 30.0–36.0)
MCV: 82 fl (ref 78.0–100.0)
MONO ABS: 0.3 10*3/uL (ref 0.1–1.0)
MONOS PCT: 5.2 % (ref 3.0–12.0)
NEUTROS ABS: 3.8 10*3/uL (ref 1.4–7.7)
Neutrophils Relative %: 62.3 % (ref 43.0–77.0)
Platelets: 180 10*3/uL (ref 150.0–400.0)
RBC: 5.41 Mil/uL — ABNORMAL HIGH (ref 3.87–5.11)
RDW: 13.9 % (ref 11.5–15.5)
WBC: 6 10*3/uL (ref 4.0–10.5)

## 2014-10-26 LAB — HEPATIC FUNCTION PANEL
ALBUMIN: 4.5 g/dL (ref 3.5–5.2)
ALT: 13 U/L (ref 0–35)
AST: 21 U/L (ref 0–37)
Alkaline Phosphatase: 93 U/L (ref 39–117)
BILIRUBIN DIRECT: 0.2 mg/dL (ref 0.0–0.3)
Total Bilirubin: 0.7 mg/dL (ref 0.2–1.2)
Total Protein: 7.9 g/dL (ref 6.0–8.3)

## 2014-10-26 LAB — HEMOGLOBIN A1C: Hgb A1c MFr Bld: 6 % (ref 4.6–6.5)

## 2014-10-26 LAB — TSH: TSH: 1.47 u[IU]/mL (ref 0.35–4.50)

## 2014-10-26 MED ORDER — TRAMADOL HCL 50 MG PO TABS: 50.0000 mg | ORAL_TABLET | Freq: Three times a day (TID) | ORAL | Status: DC | PRN

## 2014-10-26 NOTE — Progress Notes (Signed)
Chief Complaint  Patient presents with  . Dizziness  . Extremity Weakness    HPI: Patient Elizabeth Rollins  With long standing djd spina and back  comes in today for SDA for  new problem evaluation. lasta seen   2 15   Felt liek when had  Hypoglycemia  Except had eaten breakfast . Was on [employer] team and felt lightheaded andhad to sit down no cp sob syncope palpations  Ate healthy that am   Less sleep that night . Felt again  .  The next day . Back was acting up after water exercise last week and took 3-4 days of muscle relaxant . ..   Not that day.  Also mobic.  Tramadol     Trip   Sister driving  [place]. This weekend and wants to get checked  mobic every day  For last 3 weeks.  Tries not to take reg meds  If not absolutely needed ROS: See pertinent positives and negatives per HPI gests shots per dr Maryjean Ka if needed not recent.ly .  Past Medical History  Diagnosis Date  . Miscarriage   . Varicella     hx as a child  . Enlarged uterus 2-02    TAH & BSO    Family History  Problem Relation Age of Onset  . Pulmonary embolism Mother     died 21  . Hypertension Mother   . Heart attack Father     died 49  . Diabetes Father   . Heart disease Father   . Allergies Sister   . Diabetes Brother   . Diabetes Brother   . Diabetes Sister   . Other Sister     Congestive Heart Failure  . Diabetes Sister   . Colon cancer Neg Hx     History   Social History  . Marital Status: Married    Spouse Name: N/A    Number of Children: N/A  . Years of Education: N/A   Social History Main Topics  . Smoking status: Former Smoker    Quit date: 10/12/1982  . Smokeless tobacco: Never Used  . Alcohol Use: No  . Drug Use: No  . Sexual Activity: Yes    Birth Control/ Protection: Surgical   Other Topics Concern  . 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 Specialty Vitamins Products (MAGNESIUM, AMINO ACID CHELATE,) 133 MG tablet Take 1 tablet by mouth daily.  . fish oil-omega-3 fatty acids 1000 MG capsule Take 2 g by mouth daily.    . meloxicam (MOBIC) 15 MG tablet take 1/2 tablet to 1 tablet by mouth daily  . methocarbamol (ROBAXIN) 500 MG tablet take 1 tablet by mouth every 6 hours if needed for muscle spasm  . predniSONE (DELTASONE) 10 MG tablet Take pills per day,6,6,6,4,4,4,2,2,2,1,1,1  . traMADol (ULTRAM) 50 MG tablet Take 1 tablet (50 mg total) by mouth every 8 (eight) hours as needed.  [name] VITAMIN D, CHOLECALCIFEROL, PO Take by mouth.   . [DISCONTINUED] Multiple Vitamin (CALCIUM COMPLEX PO) Take by mouth.  . [DISCONTINUED] traMADol (ULTRAM) 50 MG tablet Take 1 tablet (50 mg total) by mouth every 8 (eight) hours as needed.    EXAM:  BP 130/94 mmHg  Temp(Src) 98 F (36.7 C) (Oral)  Wt 139 lb (  63.05 kg)  Body mass index is 22.1 kg/(m^2).  GENERAL: vitals reviewed and listed above, alert, oriented, appears well hydrated and in no acute distress HEENT: atraumatic, conjunctiva  clear, no obvious abnormalities on inspection of external nose and ears  NECK: no obvious masses on inspection palpation  LUNGS: clear to auscultation bilaterally, no wheezes, rales or rhonchi, good air movement CV: HRRR, no clubbing cyanosis or  peripheral edema nl cap refill  Abdomen:  Sof,t normal bowel sounds without hepatosplenomegaly, no guarding rebound or masses no CVA tenderness MS: moves all extremities without noticeable focal  abnormality PSYCH: pleasant and cooperative, no obvious depression or anxiety   ASSESSMENT AND PLAN:  Discussed the following assessment and plan:  Light headed - nl exam reassuring  hx of same may have been  circumstance  check labs today and fu if continuing no hx cv sx with exercise  - Plan: Basic metabolic panel, CBC with Differential, TSH, Hemoglobin A1c,  Hepatic function panel  Medication management - Plan: Basic metabolic panel, CBC with Differential, TSH, Hemoglobin A1c, Hepatic function panel  Family history of diabetes mellitus - Plan: Basic metabolic panel, CBC with Differential, TSH, Hemoglobin A1c, Hepatic function panel  High risk medication use - mobic  will follow labs - Plan: Basic metabolic panel, CBC with Differential, TSH, Hemoglobin A1c, Hepatic function panel  Other fatigue - Plan: Basic metabolic panel, CBC with Differential, TSH, Hemoglobin A1c, Hepatic function panel  Displacement of cervical intervertebral disc without myelopathy Exam reassuring  Monitor labs and fu if . persistent or progressive or when due for her visit  -Patient advised to return or notify health care team  if symptoms worsen ,persist or new concerns arise.  Patient Instructions  Continue healthy eating will check  Labs and let  You know results.  Then plan fu or when you are due.  01/22/2015      Standley Brooking. Panosh M.D.  Total visit 8mins > 50% spent counseling and coordinating care

## 2014-10-26 NOTE — Progress Notes (Signed)
Pre visit review using our clinic review tool, if applicable. No additional management support is needed unless otherwise documented below in the visit note. 

## 2014-10-26 NOTE — Patient Instructions (Signed)
Continue healthy eating will check  Labs and let  You know results.  Then plan fu or when you are due.  01/22/2015

## 2014-10-31 ENCOUNTER — Telehealth: Payer: Self-pay | Admitting: Internal Medicine

## 2014-10-31 NOTE — Telephone Encounter (Signed)
See result note.  Pt notified of results by telephone.

## 2014-10-31 NOTE — Telephone Encounter (Signed)
Pt would like blood work result  °

## 2014-10-31 NOTE — Telephone Encounter (Signed)
Please see result note 

## 2014-12-27 ENCOUNTER — Encounter: Payer: Self-pay | Admitting: Women's Health

## 2014-12-27 ENCOUNTER — Ambulatory Visit (INDEPENDENT_AMBULATORY_CARE_PROVIDER_SITE_OTHER): Payer: 59 | Admitting: Women's Health

## 2014-12-27 VITALS — BP 115/80 | Ht 66.0 in | Wt 137.0 lb

## 2014-12-27 DIAGNOSIS — B373 Candidiasis of vulva and vagina: Secondary | ICD-10-CM

## 2014-12-27 DIAGNOSIS — Z01419 Encounter for gynecological examination (general) (routine) without abnormal findings: Secondary | ICD-10-CM | POA: Diagnosis not present

## 2014-12-27 DIAGNOSIS — B3731 Acute candidiasis of vulva and vagina: Secondary | ICD-10-CM

## 2014-12-27 LAB — WET PREP FOR TRICH, YEAST, CLUE
Clue Cells Wet Prep HPF POC: NONE SEEN
Trich, Wet Prep: NONE SEEN

## 2014-12-27 MED ORDER — TERCONAZOLE 0.4 % VA CREA: 1.0000 | TOPICAL_CREAM | Freq: Every day | VAGINAL | Status: DC

## 2014-12-27 MED ORDER — FLUCONAZOLE 150 MG PO TABS: 150.0000 mg | ORAL_TABLET | Freq: Once | ORAL | Status: DC

## 2014-12-27 NOTE — Progress Notes (Signed)
Elizabeth Rollins [employer] 1951/10/06 [id number]    History:    Presents for annual exam.  2002 TAH with BSO for fibroids. History of normal Paps and mammograms. 2014 negative colonoscopy, mild diverticulosis. Current on vaccines. Pneumovax 2015. Labs/DEXA primary care.  Past medical history, past surgical history, family history and social history were all reviewed and documented in the EPIC chart. Retired, Training and development officer, active with church. 2002 laminectomy. 1 son doing well.  ROS:  A ROS was performed and pertinent positives and negatives are included.  Exam:  Filed Vitals:   12/27/14 0917  BP: 115/80    General appearance:  Normal Thyroid:  Symmetrical, normal in size, without palpable masses or nodularity. Respiratory  Auscultation:  Clear without wheezing or rhonchi Cardiovascular  Auscultation:  Regular rate, without rubs, murmurs or gallops  Edema/varicosities:  Not grossly evident Abdominal  Soft,nontender, without masses, guarding or rebound.  Liver/spleen:  No organomegaly noted  Hernia:  None appreciated  Skin  Inspection:  Grossly normal   Breasts: Examined lying and sitting.     Right: Without masses, retractions, discharge or axillary adenopathy.     Left: Without masses, retractions, discharge or axillary adenopathy. Gentitourinary   Inguinal/mons:  Normal without inguinal adenopathy  External genitalia:  Normal  BUS/Urethra/Skene's glands:  Normal  Vagina:  Atrophic with scant discharge wet prep positive for yeast              Cervix:  And uterus absent  Adnexa/parametria:     Rt: Without masses or tenderness.   Lt: Without masses or tenderness.  Anus and perineum: Normal  Digital rectal exam: Normal sphincter tone without palpated masses or tenderness  Assessment/Plan:  64 y.o. MBF G1 P1 for annual exam with complaint of occasional vaginal itching.  2002 TAH with BSO on no HRT Labs and DEXA primary care Yeast vaginitis  Plan: SBE's, continue annual screening  mammogram, calcium rich diet, MVI daily. Continue water aerobics, Terazol 7 externally for occasional vaginal itching. Diflucan 150 by mouth 1 dose prescription, proper use given and reviewed. Home safety, fall prevention and importance of regular exercise reviewed. UA,  Huel Cote WHNP, 1:21 PM 12/27/2014

## 2014-12-27 NOTE — Patient Instructions (Signed)
Health Recommendations for Postmenopausal Women Respected and ongoing research has looked at the most common causes of death, disability, and poor quality of life in postmenopausal women. The causes include heart disease, diseases of blood vessels, diabetes, depression, cancer, and bone loss (osteoporosis). Many things can be done to help lower the chances of developing these and other common problems. CARDIOVASCULAR DISEASE Heart Disease: A heart attack is a medical emergency. Know the signs and symptoms of a heart attack. Below are things women can do to reduce their risk for heart disease.   Do not smoke. If you smoke, quit.  Aim for a healthy weight. Being overweight causes many preventable deaths. Eat a healthy and balanced diet and drink an adequate amount of liquids.  Get moving. Make a commitment to be more physically active. Aim for 30 minutes of activity on most, if not all days of the week.  Eat for heart health. Choose a diet that is low in saturated fat and cholesterol and eliminate trans fat. Include whole grains, vegetables, and fruits. Read and understand the labels on food containers before buying.  Know your numbers. Ask your caregiver to check your blood pressure, cholesterol (total, HDL, LDL, triglycerides) and blood glucose. Work with your caregiver on improving your entire clinical picture.  High blood pressure. Limit or stop your table salt intake (try salt substitute and food seasonings). Avoid salty foods and drinks. Read labels on food containers before buying. Eating well and exercising can help control high blood pressure. STROKE  Stroke is a medical emergency. Stroke may be the result of a blood clot in a blood vessel in the brain or by a brain hemorrhage (bleeding). Know the signs and symptoms of a stroke. To lower the risk of developing a stroke:  Avoid fatty foods.  Quit smoking.  Control your diabetes, blood pressure, and irregular heart rate. THROMBOPHLEBITIS  (BLOOD CLOT) OF THE LEG  Becoming overweight and leading a stationary lifestyle may also contribute to developing blood clots. Controlling your diet and exercising will help lower the risk of developing blood clots. CANCER SCREENING  Breast Cancer: Take steps to reduce your risk of breast cancer.  You should practice "breast self-awareness." This means understanding the normal appearance and feel of your breasts and should include breast self-examination. Any changes detected, no matter how small, should be reported to your caregiver.  After age 40, you should have a clinical breast exam (CBE) every year.  Starting at age 40, you should consider having a mammogram (breast X-ray) every year.  If you have a family history of breast cancer, talk to your caregiver about genetic screening.  If you are at high risk for breast cancer, talk to your caregiver about having an MRI and a mammogram every year.  Intestinal or Stomach Cancer: Tests to consider are a rectal exam, fecal occult blood, sigmoidoscopy, and colonoscopy. Women who are high risk may need to be screened at an earlier age and more often.  Cervical Cancer:  Beginning at age 30, you should have a Pap test every 3 years as long as the past 3 Pap tests have been normal.  If you have had past treatment for cervical cancer or a condition that could lead to cancer, you need Pap tests and screening for cancer for at least 20 years after your treatment.  If you had a hysterectomy for a problem that was not cancer or a condition that could lead to cancer, then you no longer need Pap tests.    If you are between ages 65 and 70, and you have had normal Pap tests going back 10 years, you no longer need Pap tests.  If Pap tests have been discontinued, risk factors (such as a new sexual partner) need to be reassessed to determine if screening should be resumed.  Some medical problems can increase the chance of getting cervical cancer. In these  cases, your caregiver may recommend more frequent screening and Pap tests.  Uterine Cancer: If you have vaginal bleeding after reaching menopause, you should notify your caregiver.  Ovarian Cancer: Other than yearly pelvic exams, there are no reliable tests available to screen for ovarian cancer at this time except for yearly pelvic exams.  Lung Cancer: Yearly chest X-rays can detect lung cancer and should be done on high risk women, such as cigarette smokers and women with chronic lung disease (emphysema).  Skin Cancer: A complete body skin exam should be done at your yearly examination. Avoid overexposure to the sun and ultraviolet light lamps. Use a strong sun block cream when in the sun. All of these things are important for lowering the risk of skin cancer. MENOPAUSE Menopause Symptoms: Hormone therapy products are effective for treating symptoms associated with menopause:  Moderate to severe hot flashes.  Night sweats.  Mood swings.  Headaches.  Tiredness.  Loss of sex drive.  Insomnia.  Other symptoms. Hormone replacement carries certain risks, especially in older women. Women who use or are thinking about using estrogen or estrogen with progestin treatments should discuss that with their caregiver. Your caregiver will help you understand the benefits and risks. The ideal dose of hormone replacement therapy is not known. The Food and Drug Administration (FDA) has concluded that hormone therapy should be used only at the lowest doses and for the shortest amount of time to reach treatment goals.  OSTEOPOROSIS Protecting Against Bone Loss and Preventing Fracture If you use hormone therapy for prevention of bone loss (osteoporosis), the risks for bone loss must outweigh the risk of the therapy. Ask your caregiver about other medications known to be safe and effective for preventing bone loss and fractures. To guard against bone loss or fractures, the following is recommended:  If  you are younger than age 50, take 1000 mg of calcium and at least 600 mg of Vitamin D per day.  If you are older than age 50 but younger than age 70, take 1200 mg of calcium and at least 600 mg of Vitamin D per day.  If you are older than age 70, take 1200 mg of calcium and at least 800 mg of Vitamin D per day. Smoking and excessive alcohol intake increases the risk of osteoporosis. Eat foods rich in calcium and vitamin D and do weight bearing exercises several times a week as your caregiver suggests. DIABETES Diabetes Mellitus: If you have type I or type 2 diabetes, you should keep your blood sugar under control with diet, exercise, and recommended medication. Avoid starchy and fatty foods, and too many sweets. Being overweight can make diabetes control more difficult. COGNITION AND MEMORY Cognition and Memory: Menopausal hormone therapy is not recommended for the prevention of cognitive disorders such as Alzheimer's disease or memory loss.  DEPRESSION  Depression may occur at any age, but it is common in elderly women. This may be because of physical, medical, social (loneliness), or financial problems and needs. If you are experiencing depression because of medical problems and control of symptoms, talk to your caregiver about this. Physical   activity and exercise may help with mood and sleep. Community and volunteer involvement may improve your sense of value and worth. If you have depression and you feel that the problem is getting worse or becoming severe, talk to your caregiver about which treatment options are best for you. ACCIDENTS  Accidents are common and can be serious in elderly woman. Prepare your house to prevent accidents. Eliminate throw rugs, place hand bars in bath, shower, and toilet areas. Avoid wearing high heeled shoes or walking on wet, snowy, and icy areas. Limit or stop driving if you have vision or hearing problems, or if you feel you are unsteady with your movements and  reflexes. HEPATITIS C Hepatitis C is a type of viral infection affecting the liver. It is spread mainly through contact with blood from an infected person. It can be treated, but if left untreated, it can lead to severe liver damage over the years. Many people who are infected do not know that the virus is in their blood. If you are a "baby-boomer", it is recommended that you have one screening test for Hepatitis C. IMMUNIZATIONS  Several immunizations are important to consider having during your senior years, including:   Tetanus, diphtheria, and pertussis booster shot.  Influenza every year before the flu season begins.  Pneumonia vaccine.  Shingles vaccine.  Others, as indicated based on your specific needs. Talk to your caregiver about these. Document Released: 11/20/2005 Document Revised: 02/12/2014 Document Reviewed: 07/16/2008 Exit[employer] Patient Information 2015 [employer]. This information is not intended to replace advice given to you by your health care provider. Make sure you discuss any questions you have with your health care provider.  

## 2014-12-28 LAB — URINALYSIS W MICROSCOPIC + REFLEX CULTURE
BACTERIA UA: NONE SEEN
Bilirubin Urine: NEGATIVE
CRYSTALS: NONE SEEN
Casts: NONE SEEN
Glucose, UA: NEGATIVE mg/dL
Hgb urine dipstick: NEGATIVE
Ketones, ur: NEGATIVE mg/dL
NITRITE: NEGATIVE
PROTEIN: NEGATIVE mg/dL
SPECIFIC GRAVITY, URINE: 1.015 (ref 1.005–1.030)
SQUAMOUS EPITHELIAL / LPF: NONE SEEN
UROBILINOGEN UA: 0.2 mg/dL (ref 0.0–1.0)
pH: 5.5 (ref 5.0–8.0)

## 2014-12-29 LAB — URINE CULTURE
COLONY COUNT: NO GROWTH
Organism ID, Bacteria: NO GROWTH

## 2015-01-22 ENCOUNTER — Ambulatory Visit (INDEPENDENT_AMBULATORY_CARE_PROVIDER_SITE_OTHER): Payer: 59 | Admitting: Internal Medicine

## 2015-01-22 ENCOUNTER — Encounter: Payer: Self-pay | Admitting: Internal Medicine

## 2015-01-22 VITALS — BP 110/84 | Temp 97.9°F | Ht 66.5 in | Wt 140.0 lb

## 2015-01-22 DIAGNOSIS — Z833 Family history of diabetes mellitus: Secondary | ICD-10-CM | POA: Diagnosis not present

## 2015-01-22 DIAGNOSIS — Z0000 Encounter for general adult medical examination without abnormal findings: Secondary | ICD-10-CM | POA: Diagnosis not present

## 2015-01-22 DIAGNOSIS — R739 Hyperglycemia, unspecified: Secondary | ICD-10-CM

## 2015-01-22 DIAGNOSIS — Z79899 Other long term (current) drug therapy: Secondary | ICD-10-CM | POA: Diagnosis not present

## 2015-01-22 LAB — POCT URINALYSIS DIP (MANUAL ENTRY)
Bilirubin, UA: NEGATIVE
GLUCOSE UA: NEGATIVE
Ketones, POC UA: NEGATIVE
Leukocytes, UA: NEGATIVE
NITRITE UA: NEGATIVE
PH UA: 6.5
PROTEIN UA: NEGATIVE
RBC UA: NEGATIVE
Spec Grav, UA: <=1.005
UROBILINOGEN UA: 0.2

## 2015-01-22 LAB — HEPATIC FUNCTION PANEL
ALT: 12 U/L (ref 0–35)
AST: 17 U/L (ref 0–37)
Albumin: 4.3 g/dL (ref 3.5–5.2)
Alkaline Phosphatase: 89 U/L (ref 39–117)
BILIRUBIN DIRECT: 0.1 mg/dL (ref 0.0–0.3)
Total Bilirubin: 0.8 mg/dL (ref 0.2–1.2)
Total Protein: 7.4 g/dL (ref 6.0–8.3)

## 2015-01-22 LAB — BASIC METABOLIC PANEL
BUN: 17 mg/dL (ref 6–23)
CO2: 32 mEq/L (ref 19–32)
Calcium: 10.1 mg/dL (ref 8.4–10.5)
Chloride: 104 mEq/L (ref 96–112)
Creatinine, Ser: 0.8 mg/dL (ref 0.40–1.20)
GFR: 92.83 mL/min (ref >60.00)
Glucose, Bld: 95 mg/dL (ref 70–99)
POTASSIUM: 4.9 meq/L (ref 3.5–5.1)
Sodium: 138 mEq/L (ref 135–145)

## 2015-01-22 LAB — HEMOGLOBIN A1C: Hgb A1c MFr Bld: 5.9 % (ref 4.6–6.5)

## 2015-01-22 LAB — LIPID PANEL
CHOL/HDL RATIO: 3
Cholesterol: 171 mg/dL (ref 0–200)
HDL: 65.6 mg/dL (ref >39.00)
LDL Cholesterol: 96 mg/dL (ref 0–99)
NONHDL: 105.4
TRIGLYCERIDES: 46 mg/dL (ref 0.0–149.0)
VLDL: 9.2 mg/dL (ref 0.0–40.0)

## 2015-01-22 NOTE — Progress Notes (Signed)
Pre visit review using our clinic review tool, if applicable. No additional management support is needed unless otherwise documented below in the visit note.  Chief Complaint  Patient presents with  . Medicare Wellness    preventive exam     HPI: Elizabeth Rollins 64 y.o. comes in today for Preventiveand  Medicare wellness visit . No major injuries, ed visits ,hospitalizations , new medications since last visit. Paying attention to diet and exercise  Dr Ricki Miller   Retiring .  Will get new eye doc .  Battles her spinal problem but coping and staying as acitve as possible  Concern about dm family hx same  Health Maintenance  Topic Date Due  . HIV Screening  01/13/2016 (Originally 12/07/1965)  . INFLUENZA VACCINE  05/13/2015  . MAMMOGRAM  03/13/2016  . TETANUS/TDAP  02/25/2020  . COLONOSCOPY  08/30/2023  . ZOSTAVAX  Completed   Health Maintenance Review LIFESTYLE:  Exercise:  Water walking styas acitve  Tobacco/ETS:no Alcohol: per day  no Sugar beverages: limits  fami hx of dm  Sleep: 6-7  Some times  Drug use: no  Colonoscopy: utd colonscopy  MEDICARE DOCUMENT QUESTIONS  TO SCAN   Hearing:  Ok   Vision:  No limitations at present . Last eye check UTD  Safety:  Has smoke detector and wears seat belts.  No firearms. No excess sun exposure. Sees dentist regularly.  Falls:  no  Advance directive :  Reviewed  Has one.  Memory: Felt to be good  , no concern from her or her family.  Depression: No anhedonia unusual crying or depressive symptoms  Nutrition: Eats well balanced diet; adequate calcium and vitamin D. No swallowing chewing problems.  Injury: no major injuries in the last six months.  Other healthcare providers:  Reviewed today .  Social:  Lives with spouse married. 2  No pets.   Preventive parameters: up-to-date  Reviewed   ADLS:   There are no problems or need for assistance  driving, feeding, obtaining food, dressing, toileting and bathing, managing  money using phone. She is independent.    ROS:  GEN/ HEENT: No fever, significant weight changes sweats headaches vision problems hearing changes, CV/ PULM; No chest pain shortness of breath cough, syncope,edema  change in exercise tolerance. GI /GU: No adominal pain, vomiting, change in bowel habits. No blood in the stool. No significant GU symptoms. SKIN/HEME: ,no acute skin rashes suspicious lesions or bleeding. No lymphadenopathy, nodules, masses.  NEURO/ PSYCH:  No neurologic signs such as weakness numbness. No depression anxiety. IMM/ Allergy: No unusual infections.  Allergy .   REST of 12 system review negative except as per HPI   Past Medical History  Diagnosis Date  . Miscarriage   . Varicella     hx as a child  . Enlarged uterus 2-02    TAH & BSO    Family History  Problem Relation Age of Onset  . Pulmonary embolism Mother     died 44  . Hypertension Mother   . Heart attack Father     died 46  . Diabetes Father   . Heart disease Father   . Allergies Sister   . Diabetes Brother   . Diabetes Brother   . Diabetes Sister   . Other Sister     Congestive Heart Failure  . Diabetes Sister   . Colon cancer Neg Hx     History   Social History  . Marital Status: Married    Spouse  Name: N/A  . Number of Children: N/A  . Years of Education: N/A   Social History Main Topics  . Smoking status: Former Smoker    Quit date: 10/12/1982  . Smokeless tobacco: Never Used  . Alcohol Use: No  . Drug Use: No  . Sexual Activity: Yes    Birth Control/ Protection: Surgical     Comment: declined insurance questions   Other Topics Concern  . 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 Cholecalciferol (VITAMIN D) 2000 UNITS tablet Take 2,000 Units by  mouth daily.  . fish oil-omega-3 fatty acids 1000 MG capsule Take 2 g by mouth daily.    . meloxicam (MOBIC) 15 MG tablet take 1/2 tablet to 1 tablet by mouth daily  . Specialty Vitamins Products (MAGNESIUM, AMINO ACID CHELATE,) 133 MG tablet Take 1 tablet by mouth daily.  . [DISCONTINUED] fluconazole (DIFLUCAN) 150 MG tablet Take 1 tablet (150 mg total) by mouth once.  . [DISCONTINUED] terconazole (TERAZOL 7) 0.4 % vaginal cream Place 1 applicator vaginally at bedtime.  . [DISCONTINUED] VITAMIN D, CHOLECALCIFEROL, PO Take 1 tablet by mouth daily.     EXAM:  BP 110/84 mmHg  Temp(Src) 97.9 F (36.6 C) (Oral)  Ht 5' 6.5" (1.689 m)  Wt 140 lb (63.504 kg)  BMI 22.26 kg/m2  Body mass index is 22.26 kg/(m^2).  Physical Exam: Vital signs reviewed EBX:IDHW is a well-developed well-nourished alert cooperative   who appears stated age in no acute distress.  HEENT: normocephalic atraumatic , Eyes: PERRL EOM's full, conjunctiva clear, Nares: paten,t no deformity discharge or tenderness., Ears: no deformity EAC's clear TMs with normal landmarks. Mouth: clear OP, no lesions, edema.  Moist mucous membranes. Dentition in adequate repair. NECK: supple without masses, thyromegaly or bruits. CHEST/PULM:  Clear to auscultation and percussion breath sounds equal no wheeze , rales or rhonchi. No chest wall deformities or tenderness. Breast: normal by inspection . No dimpling, discharge, masses, tenderness or discharge . Fullness left axilla  CV: PMI is nondisplaced, S1 S2 no gallops, murmurs, rubs. Peripheral pulses are full without delay.No JVD .  ABDOMEN: Bowel sounds normal nontender  No guard or rebound, no hepato splenomegal no CVA tenderness.  No hernia. Extremtities:  No clubbing cyanosis or edema, no acute joint swelling or redness no focal atrophy NEURO:  Oriented x3, cranial nerves 3-12 appear to be intact, left eye drift medial on eoms  no obvious focal weakness,gait within normal limits  asymmetrical   SKIN: No acute rashes normal turgor, color, no bruising or petechiae. PSYCH: Oriented, good eye contact, no obvious depression anxiety, cognition and judgment appear normal. LN: no cervical axillary inguinal adenopathy No noted deficits in memory, attention, and speech.   Lab Results  Component Value Date   WBC 6.0 10/26/2014   HGB 14.7 10/26/2014   HCT 44.4 10/26/2014   PLT 180.0 10/26/2014   GLUCOSE 95 01/22/2015   CHOL 171 01/22/2015   TRIG 46.0 01/22/2015   HDL 65.60 01/22/2015   LDLCALC 96 01/22/2015   ALT 12 01/22/2015   AST 17 01/22/2015   NA 138 01/22/2015   K 4.9 01/22/2015   CL 104 01/22/2015   CREATININE 0.80 01/22/2015   BUN  17 01/22/2015   CO2 32 01/22/2015   TSH 1.47 10/26/2014   HGBA1C 5.9 01/22/2015   BP Readings from Last 3 Encounters:  01/22/15 110/84  12/27/14 115/80  10/26/14 130/94   Wt Readings from Last 3 Encounters:  01/22/15 140 lb (63.504 kg)  12/27/14 137 lb (62.143 kg)  10/26/14 139 lb (63.05 kg)    ASSESSMENT AND PLAN:  Discussed the following assessment and plan:  Visit for preventive health examination - Plan: Basic metabolic panel, Hemoglobin A1c, Hepatic function panel, Lipid panel, POCT urinalysis dipstick  Medicare annual wellness visit, initial  Hyperglycemia - pt concerned about getting dm a1c 6 last check repeat today althougj 2 days early then yearly  - Plan: Basic metabolic panel, Hemoglobin A1c, Hepatic function panel, Lipid panel, POCT urinalysis dipstick  Family history of diabetes mellitus - Plan: Basic metabolic panel, Hemoglobin A1c, Hepatic function panel, Lipid panel, POCT urinalysis dipstick  Medication management  Hx of spinal disease disc degenerative   Stable doing exercise  Managing  As best possible  Has some  ue numbness   roight 4 5 fingers no  New weakness Bmp ,onmitoring  ? [facility] Patient Care Team: Burnis Medin, MD as PCP - General Huel Cote, NP as Nurse Practitioner (Obstetrics and  Gynecology)  Patient Instructions  Continue lifestyle intervention healthy eating and exercise . yealry visit  Pneumonia vaccine in a year  dexa scan at age 57 next year.   Healthy lifestyle includes : At least 150 minutes of exercise weeks  , weight at healthy levels, which is usually   BMI 19-25. Avoid trans fats and processed foods;  Increase fresh fruits and veges to 5 servings per day. And avoid sweet beverages including tea and juice. Mediterranean diet with olive oil and nuts have been noted to be heart and brain healthy . Avoid tobacco products . Limit  alcohol to  7 per week for women and 14 servings for men.  Get adequate sleep . Wear seat belts . Don't text and drive .       Standley Brooking. Iyannah Blake M.D.

## 2015-01-22 NOTE — Patient Instructions (Signed)
Continue lifestyle intervention healthy eating and exercise . yealry visit  Pneumonia vaccine in a year  dexa scan at age 64 next year.   Healthy lifestyle includes : At least 150 minutes of exercise weeks  , weight at healthy levels, which is usually   BMI 19-25. Avoid trans fats and processed foods;  Increase fresh fruits and veges to 5 servings per day. And avoid sweet beverages including tea and juice. Mediterranean diet with olive oil and nuts have been noted to be heart and brain healthy . Avoid tobacco products . Limit  alcohol to  7 per week for women and 14 servings for men.  Get adequate sleep . Wear seat belts . Don't text and drive .

## 2015-02-28 ENCOUNTER — Other Ambulatory Visit: Payer: Self-pay

## 2015-02-28 DIAGNOSIS — Z1231 Encounter for screening mammogram for malignant neoplasm of breast: Secondary | ICD-10-CM

## 2015-03-28 ENCOUNTER — Ambulatory Visit
Admission: RE | Admit: 2015-03-28 | Discharge: 2015-03-28 | Disposition: A | Discharge status: Home / Self Care | Payer: Medicare Other | Source: Ambulatory Visit

## 2015-03-28 DIAGNOSIS — Z1231 Encounter for screening mammogram for malignant neoplasm of breast: Secondary | ICD-10-CM

## 2015-08-20 ENCOUNTER — Encounter: Payer: Self-pay | Admitting: Internal Medicine

## 2015-08-20 ENCOUNTER — Ambulatory Visit (INDEPENDENT_AMBULATORY_CARE_PROVIDER_SITE_OTHER): Payer: PPO | Admitting: Internal Medicine

## 2015-08-20 VITALS — BP 142/94 | HR 80 | Temp 97.9°F | Wt 142.9 lb

## 2015-08-20 DIAGNOSIS — Z23 Encounter for immunization: Secondary | ICD-10-CM | POA: Diagnosis not present

## 2015-08-20 DIAGNOSIS — J019 Acute sinusitis, unspecified: Secondary | ICD-10-CM

## 2015-08-20 MED ORDER — AMOXICILLIN-POT CLAVULANATE 875-125 MG PO TABS: 1.0000 | ORAL_TABLET | Freq: Two times a day (BID) | ORAL | Status: DC

## 2015-08-20 NOTE — Progress Notes (Signed)
Pre visit review using our clinic review tool, if applicable. No additional management support is needed unless otherwise documented below in the visit note.  Chief Complaint  Patient presents with  . Cough    X2wks  . Headache  . Sinus Pressure/Pain    HPI: Patient Elizabeth Rollins  comes in today for SDA for  new problem evaluation. Sx for a  month in September  And ur sx  Self rx .  And better  This time  2 weeks   Sinus sx congestion.sinus drainage coughing and soreness . Gargling .  No meds  No fever some chills .  right side face ha last night.  Used vicks vapo rub. Right cheeck pain last pm copious yellow green  ROS: See pertinent positives and negatives per HPI.cough no hemoptysis   Past Medical History  Diagnosis Date  . Miscarriage   . Varicella     hx as a child  . Enlarged uterus 2-02    TAH & BSO    Family History  Problem Relation Age of Onset  . Pulmonary embolism Mother     died 56  . Hypertension Mother   . Heart attack Father     died 40  . Diabetes Father   . Heart disease Father   . Allergies Sister   . Diabetes Brother   . Diabetes Brother   . Diabetes Sister   . Other Sister     Congestive Heart Failure  . Diabetes Sister   . Colon cancer Neg Hx     Social History   Social History  . Marital Status: Married    Spouse Name: N/A  . Number of Children: N/A  . Years of Education: N/A   Social History Main Topics  . Smoking status: Former Smoker    Quit date: 10/12/1982  . Smokeless tobacco: Never Used  . Alcohol Use: No  . Drug Use: No  . Sexual Activity: Yes    Birth Control/ Protection: Surgical     Comment: declined insurance questions   Other Topics Concern  . 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  Cholecalciferol (VITAMIN D) 2000 UNITS tablet Take 2,000 Units by mouth daily.    . fish oil-omega-3 fatty acids 1000 MG capsule Take 2 g by mouth daily.      [name] Specialty Vitamins Products (MAGNESIUM, AMINO ACID CHELATE,) 133 MG tablet Take 1 tablet by mouth daily.    . meloxicam (MOBIC) 15 MG tablet take 1/2 tablet to 1 tablet by mouth daily (Patient not taking: Reported on 08/20/2015) 30 tablet 2   No facility-administered medications prior to visit.     EXAM:  BP 142/94 mmHg  Pulse 80  Temp(Src) 97.9 F (36.6 C) (Oral)  Wt 142 lb 14.4 oz (64.819 kg)  Body mass index is 22.72 kg/(m^2). WDWN in NAD  quiet respirations; moderate  congested  somewhat hoarse. Non toxic . ocass cough  HEENT: Normocephalic ;atraumatic , Eyes;  PERRL, EOMs  Full, lids and conjunctiva clear,,Ears: no deformities, canals nl, TM landmarks normal, Nose: no deformity or discharge but congested;face minimally tender Mouth : OP clear without lesion or edema . Neck: Supple without adenopathy  or masses or bruits Chest:  Clear to A without wheezes rales or rhonchi CV:  S1-S2 no gallops or murmurs peripheral perfusion is normal Skin :nl perfusion and no acute rashes  ASSESSMENT AND PLAN:  Discussed the following assessment and plan:  Acute sinusitis with symptoms greater than 10 days - prob bacterial  compolicaiton  Need for prophylactic vaccination and inoculation against influenza - Plan: Flu Vaccine QUAD 36+ mos PF IM (Fluarix & Fluzone Quad PF)   Expectant management. Ok for flu vaccine today -Patient advised to return or notify health care team  if symptoms worsen ,persist or new concerns arise.  Patient Instructions  RX   for sinusitis  Saline  Nose spray .  Add antibiotic .     Sinusitis, Adult Sinusitis is redness, soreness, and inflammation of the paranasal sinuses. Paranasal sinuses are air pockets within the bones of your face. They are located beneath your eyes, in the middle of your forehead, and  above your eyes. In healthy paranasal sinuses, mucus is able to drain out, and air is able to circulate through them by way of your nose. However, when your paranasal sinuses are inflamed, mucus and air can become trapped. This can allow bacteria and other germs to grow and cause infection. Sinusitis can develop quickly and last only a short time (acute) or continue over a long period (chronic). Sinusitis that lasts for more than 12 weeks is considered chronic. CAUSES Causes of sinusitis include:  Allergies.  Structural abnormalities, such as displacement of the cartilage that separates your nostrils (deviated septum), which can decrease the air flow through your nose and sinuses and affect sinus drainage.  Functional abnormalities, such as when the small hairs (cilia) that line your sinuses and help remove mucus do not work properly or are not present. SIGNS AND SYMPTOMS Symptoms of acute and chronic sinusitis are the same. The primary symptoms are pain and pressure around the affected sinuses. Other symptoms include:  Upper toothache.  Earache.  Headache.  Bad breath.  Decreased sense of smell and taste.  A cough, which worsens when you are lying flat.  Fatigue.  Fever.  Thick drainage from your nose, which often is green and may contain pus (purulent).  Swelling and warmth over the affected sinuses. DIAGNOSIS Your health care provider will perform a physical exam. During your exam, your health care provider may perform any of the following to help determine if you have acute sinusitis or chronic sinusitis:  Look in your nose for signs of abnormal growths in your nostrils (nasal polyps).  Tap over the affected sinus to check for signs of infection.  View the inside of your sinuses using an imaging device that has a light attached (endoscope). If your health care provider suspects that you have chronic sinusitis, one or more of the following tests may be  recommended:  Allergy tests.  Nasal culture. A sample of mucus is taken from your nose, sent to a lab, and screened for bacteria.  Nasal cytology. A sample of mucus is taken from your nose and examined by your health care provider to determine if your sinusitis is related to an allergy. TREATMENT Most cases of acute sinusitis are related to a viral infection and will resolve on their own within 10 days. Sometimes, medicines are prescribed to help relieve symptoms of both acute and chronic sinusitis. These may include pain medicines, decongestants, nasal steroid sprays, or saline sprays. However, for sinusitis related to a bacterial infection, your health care  provider will prescribe antibiotic medicines. These are medicines that will help kill the bacteria causing the infection. Rarely, sinusitis is caused by a fungal infection. In these cases, your health care provider will prescribe antifungal medicine. For some cases of chronic sinusitis, surgery is needed. Generally, these are cases in which sinusitis recurs more than 3 times per year, despite other treatments. HOME CARE INSTRUCTIONS  Drink plenty of water. Water helps thin the mucus so your sinuses can drain more easily.  Use a humidifier.  Inhale steam 3-4 times a day (for example, sit in the bathroom with the shower running).  Apply a warm, moist washcloth to your face 3-4 times a day, or as directed by your health care provider.  Use saline nasal sprays to help moisten and clean your sinuses.  Take medicines only as directed by your health care provider.  If you were prescribed either an antibiotic or antifungal medicine, finish it all even if you start to feel better. SEEK IMMEDIATE MEDICAL CARE IF:  You have increasing pain or severe headaches.  You have nausea, vomiting, or drowsiness.  You have swelling around your face.  You have vision problems.  You have a stiff neck.  You have difficulty breathing.   This  information is not intended to replace advice given to you by your health care provider. Make sure you discuss any questions you have with your health care provider.   Document Released: 09/28/2005 Document Revised: 10/19/2014 Document Reviewed: 10/13/2011 Elsevier Interactive Patient Education 2016 [place] K. Tryone Kille M.D.

## 2015-08-20 NOTE — Patient Instructions (Addendum)
RX   for sinusitis  Saline  Nose spray .  Add antibiotic .     Sinusitis, Adult Sinusitis is redness, soreness, and inflammation of the paranasal sinuses. Paranasal sinuses are air pockets within the bones of your face. They are located beneath your eyes, in the middle of your forehead, and above your eyes. In healthy paranasal sinuses, mucus is able to drain out, and air is able to circulate through them by way of your nose. However, when your paranasal sinuses are inflamed, mucus and air can become trapped. This can allow bacteria and other germs to grow and cause infection. Sinusitis can develop quickly and last only a short time (acute) or continue over a long period (chronic). Sinusitis that lasts for more than 12 weeks is considered chronic. CAUSES Causes of sinusitis include:  Allergies.  Structural abnormalities, such as displacement of the cartilage that separates your nostrils (deviated septum), which can decrease the air flow through your nose and sinuses and affect sinus drainage.  Functional abnormalities, such as when the small hairs (cilia) that line your sinuses and help remove mucus do not work properly or are not present. SIGNS AND SYMPTOMS Symptoms of acute and chronic sinusitis are the same. The primary symptoms are pain and pressure around the affected sinuses. Other symptoms include:  Upper toothache.  Earache.  Headache.  Bad breath.  Decreased sense of smell and taste.  A cough, which worsens when you are lying flat.  Fatigue.  Fever.  Thick drainage from your nose, which often is green and may contain pus (purulent).  Swelling and warmth over the affected sinuses. DIAGNOSIS Your health care provider will perform a physical exam. During your exam, your health care provider may perform any of the following to help determine if you have acute sinusitis or chronic sinusitis:  Look in your nose for signs of abnormal growths in your nostrils (nasal  polyps).  Tap over the affected sinus to check for signs of infection.  View the inside of your sinuses using an imaging device that has a light attached (endoscope). If your health care provider suspects that you have chronic sinusitis, one or more of the following tests may be recommended:  Allergy tests.  Nasal culture. A sample of mucus is taken from your nose, sent to a lab, and screened for bacteria.  Nasal cytology. A sample of mucus is taken from your nose and examined by your health care provider to determine if your sinusitis is related to an allergy. TREATMENT Most cases of acute sinusitis are related to a viral infection and will resolve on their own within 10 days. Sometimes, medicines are prescribed to help relieve symptoms of both acute and chronic sinusitis. These may include pain medicines, decongestants, nasal steroid sprays, or saline sprays. However, for sinusitis related to a bacterial infection, your health care provider will prescribe antibiotic medicines. These are medicines that will help kill the bacteria causing the infection. Rarely, sinusitis is caused by a fungal infection. In these cases, your health care provider will prescribe antifungal medicine. For some cases of chronic sinusitis, surgery is needed. Generally, these are cases in which sinusitis recurs more than 3 times per year, despite other treatments. HOME CARE INSTRUCTIONS  Drink plenty of water. Water helps thin the mucus so your sinuses can drain more easily.  Use a humidifier.  Inhale steam 3-4 times a day (for example, sit in the bathroom with the shower running).  Apply a warm, moist washcloth to your face  3-4 times a day, or as directed by your health care provider.  Use saline nasal sprays to help moisten and clean your sinuses.  Take medicines only as directed by your health care provider.  If you were prescribed either an antibiotic or antifungal medicine, finish it all even if you start  to feel better. SEEK IMMEDIATE MEDICAL CARE IF:  You have increasing pain or severe headaches.  You have nausea, vomiting, or drowsiness.  You have swelling around your face.  You have vision problems.  You have a stiff neck.  You have difficulty breathing.   This information is not intended to replace advice given to you by your health care provider. Make sure you discuss any questions you have with your health care provider.   Document Released: 09/28/2005 Document Revised: 10/19/2014 Document Reviewed: 10/13/2011 Elsevier Interactive Patient Education [employer].

## 2015-08-27 ENCOUNTER — Telehealth: Payer: Self-pay | Admitting: Internal Medicine

## 2015-08-27 NOTE — Telephone Encounter (Signed)
FYI. Note states patient will call back if symptoms do not improve

## 2015-08-27 NOTE — Telephone Encounter (Signed)
[facility] - Client [place] [facility] Patient Name: Elizabeth Rollins DOB: 1951-01-24 Initial Comment Caller states she was given antibiotics, finished. Still having symptoms. DX- Sinus infection. Nurse Assessment Nurse: Mechele Dawley, RN, Amy Date/Time Eilene Ghazi Time): 08/27/2015 10:41:29 AM Confirm and document reason for call. If symptomatic, describe symptoms. ---CALLER STATES THAT SHE IS HAVING STILL SINUS SYMPTOMS. SHE STATES THAT THE LAST ANTIBIOTIC WAS THIS MORNING. SHE WAS TAKING THE AUGMENTIN. NO FEVER. SHE STATES SHE HAS CLEARED UP A LOT. SHE IS STILL HAVING SYMPTOMS OF COUGHING, DRAINAGE. SHE WAS GETTING A LOT OF COLORED SECRETIONS FROM THE NOSE. NOW ITS CLEAR WITH SOMETIMES A LITTLE YELLOW SECRETIONS. SHE STATES SHE IS ABLE TO SLEEP WITHOUT A LOT OF DRAINAGE. SHE THINKS SHE IS AT THE END OF THIS. SHE HAS NOT BEEN ON ANTIBIOTIC IN MANY YEARS. NO CHEST DISCOMFORT OR SOB. SHE IS HAVING TO CLEAR HER THROAT DURING THE CALL CONSTANTLY. Has the patient traveled out of the country within the last 30 days? ---Not Applicable Does the patient have any new or worsening symptoms? ---Yes Will a triage be completed? ---Yes Related visit to physician within the last 2 weeks? ---Yes Does the PT have any chronic conditions? (i.e. diabetes, asthma, etc.) ---No Guidelines Guideline Title Affirmed Question Affirmed Notes Sinus Infection on Antibiotic Follow-up Call [1] Reasonable improvement on antibiotic AND [2] no fever or pain (all triage questions negative) Final Disposition User [place], [place], Amy Comments JUST TOOK LAST ANTIBIOTIC THIS MORNING. SHE IS GOING TO START USING SOME SALINE SPRAY AND SOME BUTTERSCOTCH SUGAR FREE CANDY FOR COMFORT IN THE THROAT. IF NO IMPROVEMENT IN 24 HOURS SHE WILL CALL BACK. Referrals REFERRED TO PCP OFFICE Disagree/Comply: Comply PLEASE NOTE: All timestamps contained within this report  are represented as [place] Standard Time. CONFIDENTIALTY NOTICE: This fax transmission is intended only for the addressee. It contains information that is legally privileged, confidential or otherwise protected from use or disclosure. If you are not the intended recipient, you are strictly prohibited from reviewing, disclosing, copying using or disseminating any of this information or taking any action in reliance on or regarding this information. If you have received this fax in error, please notify us immediately by telephone so that we can arrange for its return to [place]. Phone: [phone], Toll-Free: [phone], Fax: [phone] Page: 2 of 2 Call Id:

## 2016-01-26 NOTE — Progress Notes (Signed)
Chief Complaint  Patient presents with  . Medicare Wellness    high risk fam hx DM     HPI: Elizabeth Rollins 65 y.o. comes in today for welcome  To Preventive Medicare wellness visit .Since last visit. Only meloxicam  With flare  Last year .   Trying hard to do lsi and prevent getting dm etc   Borderline bg issues in past  .  Health Maintenance  Topic Date Due  . HIV Screening  12/07/1965  . PAP SMEAR  01/27/2015  . DEXA SCAN  12/08/2015  . INFLUENZA VACCINE  05/12/2016  . PNA vac Low Risk Adult (2 of 2 - PPSV23) 01/26/2017  . MAMMOGRAM  03/27/2017  . TETANUS/TDAP  02/25/2020  . COLONOSCOPY  08/30/2023  . ZOSTAVAX  Completed  . Hepatitis C Screening  Completed   Health Maintenance Review LIFESTYLE:  TAD:   Neg  Sugar beverages: no Sleep:6-8    MEDICARE DOCUMENT QUESTIONS  TO SCAN   Hearing: ok  Vision:  No limitations at present . Last eye check UTD  Safety:  Has smoke detector and wears seat belts.  No firearms. No excess sun exposure. Sees dentist regularly.  Falls:  [place] directive :  Reviewed  No formal one  HO disc and to be mailed  Memory: Felt to be good  , no concern from her or her family.  Depression: No anhedonia unusual crying or depressive symptoms  Nutrition: Eats well balanced diet; adequate calcium and vitamin D. No swallowing chewing problems.  Injury: no major injuries in the last six months.  Other healthcare providers:  Reviewed today .  Social:  Lives with spouse married. No pets.   Preventive parameters: up-to-date  Reviewed   ADLS:   There are no problems or need for assistance  driving, feeding, obtaining food, dressing, toileting and bathing, managing money using phone. She is independent.     ROS:  GEN/ HEENT: No fever, significant weight changes sweats headaches vision problems hearing changes, CV/ PULM; No chest pain shortness of breath cough, syncope,edema  change in exercise tolerance. GI /GU: No adominal pain,  vomiting, change in bowel habits. No blood in the stool. No significant GU symptoms. SKIN/HEME: ,no acute skin rashes suspicious lesions or bleeding. No lymphadenopathy, nodules, masses.  NEURO/ PSYCH:  No neurologic signs such as weakness numbness. No depression anxiety. IMM/ Allergy: No unusual infections.  Allergy .   REST of 12 system review negative except as per HPI   Past Medical History  Diagnosis Date  . Miscarriage   . Varicella     hx as a child  . Enlarged uterus 2-02    TAH & BSO    Family History  Problem Relation Age of Onset  . Pulmonary embolism Mother     died 41  . Hypertension Mother   . Heart attack Father     died 43  . Diabetes Father   . Heart disease Father   . Allergies Sister   . Diabetes Brother   . Diabetes Brother   . Diabetes Sister   . Other Sister     Congestive Heart Failure  . Diabetes Sister   . Colon cancer Neg Hx     Social History   Social History  . Marital Status: Married    Spouse Name: N/A  . Number of Children: N/A  . Years of Education: N/A   Social History Main Topics  . Smoking status: Former Smoker  Quit date: 10/12/1982  . Smokeless tobacco: Never Used  . Alcohol Use: No  . Drug Use: No  . Sexual Activity: Yes    Birth Control/ Protection: Surgical     Comment: declined insurance questions   Other Topics Concern  . 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 CALCIUM PO Take by mouth.  . Cholecalciferol (VITAMIN D) 2000 UNITS tablet Take 2,000 Units by mouth daily.  . fish oil-omega-3 fatty acids 1000 MG capsule Take 2 g by mouth daily.    [name] MAGNESIUM PO Take 500 mg by mouth 2 (two) times daily.  . meloxicam (MOBIC) 15 MG tablet take 1/2 tablet to 1 tablet by mouth  daily  . Probiotic Product (PROBIOTIC PO) Take by mouth. Reported on 01/27/2016  . [DISCONTINUED] amoxicillin-clavulanate (AUGMENTIN) 875-125 MG tablet Take 1 tablet by mouth every 12 (twelve) hours.  . [DISCONTINUED] Specialty Vitamins Products (MAGNESIUM, AMINO ACID CHELATE,) 133 MG tablet Take 1 tablet by mouth daily.   No facility-administered encounter medications on file as of 01/27/2016.    EXAM:  BP 112/74 mmHg  Temp(Src) 97.7 F (36.5 C) (Oral)  Ht '5\' 6"'$  (1.676 m)  Wt 139 lb (63.05 kg)  BMI 22.45 kg/m2  Body mass index is 22.45 kg/(m^2).  Physical Exam: Vital signs reviewed MWN:UUVO is a well-developed well-nourished alert cooperative   who appears stated age in no acute distress.  HEENT: normocephalic atraumatic , Eyes: PERRL EOM's some drift wears glasses , conjunctiva clear, Nares: paten,t no deformity discharge or tenderness., Ears: no deformity EAC's clear TMs with normal landmarks. Mouth: clear OP, no lesions, edema.  Moist mucous membranes. Dentition in adequate repair. NECK: supple without masses, thyromegaly or bruits. CHEST/PULM:  Clear to auscultation and percussion breath sounds equal no wheeze , rales or rhonchi. No chest wall deformities or tenderness. CV: PMI is nondisplaced, S1 S2 no gallops, murmurs, rubs. Peripheral pulses are full without delay.No JVD .  ABDOMEN: Bowel sounds normal nontender  No guard or rebound, no hepato splenomegal no CVA tenderness.   Extremtities:  No clubbing cyanosis or edema, no acute joint swelling or redness no focal atrophy well healed scar spine  NEURO:  Oriented x3, cranial nerves 3-12 appear to be intact, no obvious focal weakness,gait within normal limits no abnormal reflexes or asymmetrical SKIN: No acute rashes normal turgor, color, no bruising or petechiae. PSYCH: Oriented, good eye contact, no obvious depression anxiety, cognition and judgment appear normal. LN: no cervical axillary inguinal adenopathy No noted deficits in  memory, attention, and speech.     ASSESSMENT AND PLAN:  Discussed the following assessment and plan:  Welcome to Medicare preventive visit  Hyperglycemia - lsi check fbs and hg a1c today - Plan: CBC with Differential/Platelet, Basic metabolic panel, Hemoglobin A1c, TSH, POCT urinalysis dipstick, Hepatic function panel  Family history of diabetes mellitus - Plan: CBC with Differential/Platelet, Basic metabolic panel, Hemoglobin A1c, TSH, POCT urinalysis dipstick, Hepatic function panel  Displacement of cervical intervertebral disc without myelopathy - ocass use of  meloxicam montiro labs urine etc - Plan: CBC with Differential/Platelet, Basic metabolic panel, Hemoglobin A1c, TSH, POCT urinalysis dipstick, Hepatic function panel  High risk medication  use meloxicam - fortunately less  use recnetly  - Plan: CBC with Differential/Platelet, Basic metabolic panel, Hemoglobin A1c, TSH, POCT urinalysis dipstick, Hepatic function panel  Medication management - Plan: CBC with Differential/Platelet, Basic metabolic panel, Hemoglobin A1c, TSH, POCT urinalysis dipstick, Hepatic function panel  Estrogen deficiency - dexa   - Plan: DG Bone Density  Wears glasses  Need for vaccination with 13-polyvalent pneumococcal conjugate vaccine - Plan: Pneumococcal conjugate vaccine 13-valent dexa scan  To be done   Lab  As risk of  Dm  Lipids have been excellent in past  Can check every other year or thereabouts  reviewed personal risk factors  Patient Care Team: Burnis Medin, MD as PCP - General Huel Cote, NP as Nurse Practitioner (Obstetrics and Gynecology)  Patient Instructions  Continue lifestyle intervention healthy eating and exercise . Mediterranean diet   Is the healthiest  Schedule for   dexa bone density .  Next year get pneumovax  prevnar 13 this year .    Health Maintenance, Female Adopting a healthy lifestyle and getting preventive care can go a long way to promote health and  wellness. Talk with your health care provider about what schedule of regular examinations is right for you. This is a good chance for you to check in with your provider about disease prevention and staying healthy. In between checkups, there are plenty of things you can do on your own. Experts have done a lot of research about which lifestyle changes and preventive measures are most likely to keep you healthy. Ask your health care provider for more information. WEIGHT AND DIET  Eat a healthy diet  Be sure to include plenty of vegetables, fruits, low-fat dairy products, and lean protein.  Do not eat a lot of foods high in solid fats, added sugars, or salt.  Get regular exercise. This is one of the most important things you can do for your health.  Most adults should exercise for at least 150 minutes each week. The exercise should increase your heart rate and make you sweat (moderate-intensity exercise).  Most adults should also do strengthening exercises at least twice a week. This is in addition to the moderate-intensity exercise.  Maintain a healthy weight  Body mass index (BMI) is a measurement that can be used to identify possible weight problems. It estimates body fat based on height and weight. Your health care provider can help determine your BMI and help you achieve or maintain a healthy weight.  For females 71 years of age and older:   A BMI below 18.5 is considered underweight.  A BMI of 18.5 to 24.9 is normal.  A BMI of 25 to 29.9 is considered overweight.  A BMI of 30 and above is considered obese.  Watch levels of cholesterol and blood lipids  You should start having your blood tested for lipids and cholesterol at 65 years of age, then have this test every 5 years.  You may need to have your cholesterol levels checked more often if:  Your lipid or cholesterol levels are high.  You are older than 65 years of age.  You are at high risk for heart disease.  CANCER  SCREENING   Lung Cancer  Lung cancer screening is recommended for adults 47-42 years old who are at high risk for lung cancer because of a history of smoking.  A yearly low-dose CT scan of the lungs is recommended for people who:  Currently smoke.  Have quit within the  past 15 years.  Have at least a 30-pack-year history of smoking. A pack year is smoking an average of one pack of cigarettes a day for 1 year.  Yearly screening should continue until it has been 15 years since you quit.  Yearly screening should stop if you develop a health problem that would prevent you from having lung cancer treatment.  Breast Cancer  Practice breast self-awareness. This means understanding how your breasts normally appear and feel.  It also means doing regular breast self-exams. Let your health care provider know about any changes, no matter how small.  If you are in your 20s or 30s, you should have a clinical breast exam (CBE) by a health care provider every 1-3 years as part of a regular health exam.  If you are 75 or older, have a CBE every year. Also consider having a breast X-ray (mammogram) every year.  If you have a family history of breast cancer, talk to your health care provider about genetic screening.  If you are at high risk for breast cancer, talk to your health care provider about having an MRI and a mammogram every year.  Breast cancer gene (BRCA) assessment is recommended for women who have family members with BRCA-related cancers. BRCA-related cancers include:  Breast.  Ovarian.  Tubal.  Peritoneal cancers.  Results of the assessment will determine the need for genetic counseling and BRCA1 and BRCA2 testing. Cervical Cancer Your health care provider may recommend that you be screened regularly for cancer of the pelvic organs (ovaries, uterus, and vagina). This screening involves a pelvic examination, including checking for microscopic changes to the surface of your  cervix (Pap test). You may be encouraged to have this screening done every 3 years, beginning at age 97.  For women ages 44-65, health care providers may recommend pelvic exams and Pap testing every 3 years, or they may recommend the Pap and pelvic exam, combined with testing for human papilloma virus (HPV), every 5 years. Some types of HPV increase your risk of cervical cancer. Testing for HPV may also be done on women of any age with unclear Pap test results.  Other health care providers may not recommend any screening for nonpregnant women who are considered low risk for pelvic cancer and who do not have symptoms. Ask your health care provider if a screening pelvic exam is right for you.  If you have had past treatment for cervical cancer or a condition that could lead to cancer, you need Pap tests and screening for cancer for at least 20 years after your treatment. If Pap tests have been discontinued, your risk factors (such as having a new sexual partner) need to be reassessed to determine if screening should resume. Some women have medical problems that increase the chance of getting cervical cancer. In these cases, your health care provider may recommend more frequent screening and Pap tests. Colorectal Cancer  This type of cancer can be detected and often prevented.  Routine colorectal cancer screening usually begins at 65 years of age and continues through 65 years of age.  Your health care provider may recommend screening at an earlier age if you have risk factors for colon cancer.  Your health care provider may also recommend using home test kits to check for hidden blood in the stool.  A small camera at the end of a tube can be used to examine your colon directly (sigmoidoscopy or colonoscopy). This is done to check for the earliest forms of  colorectal cancer.  Routine screening usually begins at age 2.  Direct examination of the colon should be repeated every 5-10 years through 65  years of age. However, you may need to be screened more often if early forms of precancerous polyps or small growths are found. Skin Cancer  Check your skin from head to toe regularly.  Tell your health care provider about any new moles or changes in moles, especially if there is a change in a mole's shape or color.  Also tell your health care provider if you have a mole that is larger than the size of a pencil eraser.  Always use sunscreen. Apply sunscreen liberally and repeatedly throughout the day.  Protect yourself by wearing long sleeves, pants, a wide-brimmed hat, and sunglasses whenever you are outside. HEART DISEASE, DIABETES, AND HIGH BLOOD PRESSURE   High blood pressure causes heart disease and increases the risk of stroke. High blood pressure is more likely to develop in:  People who have blood pressure in the high end of the normal range (130-139/85-89 mm Hg).  People who are overweight or obese.  People who are African American.  If you are 52-64 years of age, have your blood pressure checked every 3-5 years. If you are 75 years of age or older, have your blood pressure checked every year. You should have your blood pressure measured twice--once when you are at a hospital or clinic, and once when you are not at a hospital or clinic. Record the average of the two measurements. To check your blood pressure when you are not at a hospital or clinic, you can use:  An automated blood pressure machine at a pharmacy.  A home blood pressure monitor.  If you are between 10 years and 95 years old, ask your health care provider if you should take aspirin to prevent strokes.  Have regular diabetes screenings. This involves taking a blood sample to check your fasting blood sugar level.  If you are at a normal weight and have a low risk for diabetes, have this test once every three years after 65 years of age.  If you are overweight and have a high risk for diabetes, consider being  tested at a younger age or more often. PREVENTING INFECTION  Hepatitis B  If you have a higher risk for hepatitis B, you should be screened for this virus. You are considered at high risk for hepatitis B if:  You were born in a country where hepatitis B is common. Ask your health care provider which countries are considered high risk.  Your parents were born in a high-risk country, and you have not been immunized against hepatitis B (hepatitis B vaccine).  You have HIV or AIDS.  You use needles to inject street drugs.  You live with someone who has hepatitis B.  You have had sex with someone who has hepatitis B.  You get hemodialysis treatment.  You take certain medicines for conditions, including cancer, organ transplantation, and autoimmune conditions. Hepatitis C  Blood testing is recommended for:  Everyone born from [birth year] through 1965.  Anyone with known risk factors for hepatitis C. Sexually transmitted infections (STIs)  You should be screened for sexually transmitted infections (STIs) including gonorrhea and chlamydia if:  You are sexually active and are younger than 65 years of age.  You are older than 65 years of age and your health care provider tells you that you are at risk for this type of infection.  Your sexual  activity has changed since you were last screened and you are at an increased risk for chlamydia or gonorrhea. Ask your health care provider if you are at risk.  If you do not have HIV, but are at risk, it may be recommended that you take a prescription medicine daily to prevent HIV infection. This is called pre-exposure prophylaxis (PrEP). You are considered at risk if:  You are sexually active and do not regularly use condoms or know the HIV status of your partner(s).  You take drugs by injection.  You are sexually active with a partner who has HIV. Talk with your health care provider about whether you are at high risk of being infected with HIV. If  you choose to begin PrEP, you should first be tested for HIV. You should then be tested every 3 months for as long as you are taking PrEP.  PREGNANCY   If you are premenopausal and you may become pregnant, ask your health care provider about preconception counseling.  If you may become pregnant, take 400 to 800 micrograms (mcg) of folic acid every day.  If you want to prevent pregnancy, talk to your health care provider about birth control (contraception). OSTEOPOROSIS AND MENOPAUSE   Osteoporosis is a disease in which the bones lose minerals and strength with aging. This can result in serious bone fractures. Your risk for osteoporosis can be identified using a bone density scan.  If you are 58 years of age or older, or if you are at risk for osteoporosis and fractures, ask your health care provider if you should be screened.  Ask your health care provider whether you should take a calcium or vitamin D supplement to lower your risk for osteoporosis.  Menopause may have certain physical symptoms and risks.  Hormone replacement therapy may reduce some of these symptoms and risks. Talk to your health care provider about whether hormone replacement therapy is right for you.  HOME CARE INSTRUCTIONS   Schedule regular health, dental, and eye exams.  Stay current with your immunizations.   Do not use any tobacco products including cigarettes, chewing tobacco, or electronic cigarettes.  If you are pregnant, do not drink alcohol.  If you are breastfeeding, limit how much and how often you drink alcohol.  Limit alcohol intake to no more than 1 drink per day for nonpregnant women. One drink equals 12 ounces of beer, 5 ounces of wine, or 1 ounces of hard liquor.  Do not use street drugs.  Do not share needles.  Ask your health care provider for help if you need support or information about quitting drugs.  Tell your health care provider if you often feel depressed.  Tell your health  care provider if you have ever been abused or do not feel safe at home.   This information is not intended to replace advice given to you by your health care provider. Make sure you discuss any questions you have with your health care provider.   Document Released: 04/13/2011 Document Revised: 10/19/2014 Document Reviewed: 08/30/2013 Elsevier Interactive Patient Education 2016 Malcolm protect organs, store calcium, and anchor muscles. Good health habits, such as eating nutritious foods and exercising regularly, are important for maintaining healthy bones. They can also help to prevent a condition that causes bones to lose density and become weak and brittle (osteoporosis). WHY IS BONE MASS IMPORTANT? Bone mass refers to the amount of bone tissue that you have. The higher your bone mass,  the stronger your bones. An important step toward having healthy bones throughout life is to have strong and dense bones during childhood. A young adult who has a high bone mass is more likely to have a high bone mass later in life. Bone mass at its greatest it is called peak bone mass. A large decline in bone mass occurs in older adults. In women, it occurs about the time of menopause. During this time, it is important to practice good health habits, because if more bone is lost than what is replaced, the bones will become less healthy and more likely to break (fracture). If you find that you have a low bone mass, you may be able to prevent osteoporosis or further bone loss by changing your diet and lifestyle. HOW CAN I FIND OUT IF MY BONE MASS IS LOW? Bone mass can be measured with an X-ray test that is called a bone mineral density (BMD) test. This test is recommended for all women who are age 837 or older. It may also be recommended for men who are age 850 or older, or for people who are more likely to develop osteoporosis due to:  Having bones that break easily.  Having a long-term  disease that weakens bones, such as kidney disease or rheumatoid arthritis.  Having menopause earlier than normal.  Taking medicine that weakens bones, such as steroids, thyroid hormones, or hormone treatment for breast cancer or prostate cancer.  Smoking.  Drinking three or more alcoholic drinks each day. WHAT ARE THE NUTRITIONAL RECOMMENDATIONS FOR HEALTHY BONES? To have healthy bones, you need to get enough of the right minerals and vitamins. Most nutrition experts recommend getting these nutrients from the foods that you eat. Nutritional recommendations vary from person to person. Ask your health care provider what is healthy for you. Here are some general guidelines. Calcium Recommendations Calcium is the most important (essential) mineral for bone health. Most people can get enough calcium from their diet, but supplements may be recommended for people who are at risk for osteoporosis. Good sources of calcium include:  Dairy products, such as low-fat or nonfat milk, cheese, and yogurt.  Dark green leafy vegetables, such as bok choy and broccoli.  Calcium-fortified foods, such as orange juice, cereal, bread, soy beverages, and tofu products.  Nuts, such as almonds. Follow these recommended amounts for daily calcium intake:  Children, age 83-3: 700 mg.  Children, age 85-8: 1,000 mg.  Children, age 850-13: 1,300 mg.  Teens, age 89-18: 1,300 mg.  Adults, age 27-50: 1,000 mg.  Adults, age 79-70:  Men: 1,000 mg.  Women: 1,200 mg.  Adults, age 60 or older: 1,200 mg.  Pregnant and breastfeeding females:  Teens: 1,300 mg.  Adults: 1,000 mg. Vitamin D Recommendations Vitamin D is the most essential vitamin for bone health. It helps the body to absorb calcium. Sunlight stimulates the skin to make vitamin D, so be sure to get enough sunlight. If you live in a cold climate or you do not get outside often, your health care provider may recommend that you take vitamin D supplements.  Good sources of vitamin D in your diet include:  Egg yolks.  Saltwater fish.  Milk and cereal fortified with vitamin D. Follow these recommended amounts for daily vitamin D intake:  Children and teens, age 83-18: 600 international units.  Adults, age 65 or younger: 400-800 international units.  Adults, age 54 or older: 800-1,000 international units. Other Nutrients Other nutrients for bone health include:  Phosphorus.  This mineral is found in meat, poultry, dairy foods, nuts, and legumes. The recommended daily intake for adult men and adult women is 700 mg.  Magnesium. This mineral is found in seeds, nuts, dark green vegetables, and legumes. The recommended daily intake for adult men is 400-420 mg. For adult women, it is 310-320 mg.  Vitamin K. This vitamin is found in green leafy vegetables. The recommended daily intake is 120 mg for adult men and 90 mg for adult women. WHAT TYPE OF PHYSICAL ACTIVITY IS BEST FOR BUILDING AND MAINTAINING HEALTHY BONES? Weight-bearing and strength-building activities are important for building and maintaining peak bone mass. Weight-bearing activities cause muscles and bones to work against gravity. Strength-building activities increases muscle strength that supports bones. Weight-bearing and muscle-building activities include:  Walking and hiking.  Jogging and running.  Dancing.  Gym exercises.  Lifting weights.  Tennis and racquetball.  Climbing stairs.  Aerobics. Adults should get at least 30 minutes of moderate physical activity on most days. Children should get at least 60 minutes of moderate physical activity on most days. Ask your health care provide what type of exercise is best for you. WHERE CAN I FIND MORE INFORMATION? For more information, check out the following websites:  [place]: [employer]se  [employer]:  http://www.niams.AnonymousEar.fr.asp   This information is not intended to replace advice given to you by your health care provider. Make sure you discuss any questions you have with your health care provider.   Document Released: 12/19/2003 Document Revised: 02/12/2015 Document Reviewed: 10/03/2014 Elsevier Interactive Patient Education 2016 [place] K. Braylee Lal M.D.

## 2016-01-27 ENCOUNTER — Ambulatory Visit (INDEPENDENT_AMBULATORY_CARE_PROVIDER_SITE_OTHER): Payer: Medicare Other | Admitting: Internal Medicine

## 2016-01-27 ENCOUNTER — Encounter: Payer: Self-pay | Admitting: Internal Medicine

## 2016-01-27 VITALS — BP 112/74 | Temp 97.7°F | Ht 66.0 in | Wt 139.0 lb

## 2016-01-27 DIAGNOSIS — Z0000 Encounter for general adult medical examination without abnormal findings: Secondary | ICD-10-CM | POA: Diagnosis not present

## 2016-01-27 DIAGNOSIS — E2839 Other primary ovarian failure: Secondary | ICD-10-CM

## 2016-01-27 DIAGNOSIS — Z23 Encounter for immunization: Secondary | ICD-10-CM | POA: Diagnosis not present

## 2016-01-27 DIAGNOSIS — Z833 Family history of diabetes mellitus: Secondary | ICD-10-CM | POA: Diagnosis not present

## 2016-01-27 DIAGNOSIS — Z79899 Other long term (current) drug therapy: Secondary | ICD-10-CM

## 2016-01-27 DIAGNOSIS — R739 Hyperglycemia, unspecified: Secondary | ICD-10-CM | POA: Diagnosis not present

## 2016-01-27 DIAGNOSIS — M5020 Other cervical disc displacement, unspecified cervical region: Secondary | ICD-10-CM | POA: Diagnosis not present

## 2016-01-27 DIAGNOSIS — Z973 Presence of spectacles and contact lenses: Secondary | ICD-10-CM

## 2016-01-27 LAB — HEPATIC FUNCTION PANEL
ALBUMIN: 4.4 g/dL (ref 3.5–5.2)
ALK PHOS: 68 U/L (ref 39–117)
ALT: 8 U/L (ref 0–35)
AST: 12 U/L (ref 0–37)
BILIRUBIN TOTAL: 0.7 mg/dL (ref 0.2–1.2)
Bilirubin, Direct: 0.1 mg/dL (ref 0.0–0.3)
Total Protein: 7.3 g/dL (ref 6.0–8.3)

## 2016-01-27 LAB — CBC WITH DIFFERENTIAL/PLATELET
BASOS ABS: 0 10*3/uL (ref 0.0–0.1)
Basophils Relative: 0.4 % (ref 0.0–3.0)
Eosinophils Absolute: 0.1 10*3/uL (ref 0.0–0.7)
Eosinophils Relative: 0.9 % (ref 0.0–5.0)
HCT: 42.4 % (ref 36.0–46.0)
Hemoglobin: 14.2 g/dL (ref 12.0–15.0)
Lymphocytes Relative: 40.8 % (ref 12.0–46.0)
Lymphs Abs: 2.3 10*3/uL (ref 0.7–4.0)
MCHC: 33.5 g/dL (ref 30.0–36.0)
MCV: 81.8 fl (ref 78.0–100.0)
MONO ABS: 0.3 10*3/uL (ref 0.1–1.0)
Monocytes Relative: 5.5 % (ref 3.0–12.0)
NEUTROS ABS: 3 10*3/uL (ref 1.4–7.7)
Neutrophils Relative %: 52.4 % (ref 43.0–77.0)
PLATELETS: 218 10*3/uL (ref 150.0–400.0)
RBC: 5.18 Mil/uL — AB (ref 3.87–5.11)
RDW: 14.1 % (ref 11.5–15.5)
WBC: 5.7 10*3/uL (ref 4.0–10.5)

## 2016-01-27 LAB — HEMOGLOBIN A1C: HEMOGLOBIN A1C: 5.9 % (ref 4.6–6.5)

## 2016-01-27 LAB — POCT URINALYSIS DIPSTICK
Bilirubin, UA: NEGATIVE
Blood, UA: NEGATIVE
Glucose, UA: NEGATIVE
Ketones, UA: NEGATIVE
LEUKOCYTES UA: NEGATIVE
NITRITE UA: NEGATIVE
PH UA: 5.5
PROTEIN UA: NEGATIVE
Spec Grav, UA: <=1.005
UROBILINOGEN UA: 0.2

## 2016-01-27 LAB — BASIC METABOLIC PANEL
BUN: 17 mg/dL (ref 6–23)
CALCIUM: 10 mg/dL (ref 8.4–10.5)
CO2: 29 meq/L (ref 19–32)
Chloride: 104 mEq/L (ref 96–112)
Creatinine, Ser: 0.78 mg/dL (ref 0.40–1.20)
GFR: 95.28 mL/min (ref >60.00)
Glucose, Bld: 95 mg/dL (ref 70–99)
Potassium: 4.7 mEq/L (ref 3.5–5.1)
SODIUM: 142 meq/L (ref 135–145)

## 2016-01-27 LAB — TSH: TSH: 1.63 u[IU]/mL (ref 0.35–4.50)

## 2016-01-27 NOTE — Patient Instructions (Addendum)
Continue lifestyle intervention healthy eating and exercise . Mediterranean diet   Is the healthiest  Schedule for   dexa bone density .  Next year get pneumovax  prevnar 13 this year .    Health Maintenance, Female Adopting a healthy lifestyle and getting preventive care can go a long way to promote health and wellness. Talk with your health care provider about what schedule of regular examinations is right for you. This is a good chance for you to check in with your provider about disease prevention and staying healthy. In between checkups, there are plenty of things you can do on your own. Experts have done a lot of research about which lifestyle changes and preventive measures are most likely to keep you healthy. Ask your health care provider for more information. WEIGHT AND DIET  Eat a healthy diet  Be sure to include plenty of vegetables, fruits, low-fat dairy products, and lean protein.  Do not eat a lot of foods high in solid fats, added sugars, or salt.  Get regular exercise. This is one of the most important things you can do for your health.  Most adults should exercise for at least 150 minutes each week. The exercise should increase your heart rate and make you sweat (moderate-intensity exercise).  Most adults should also do strengthening exercises at least twice a week. This is in addition to the moderate-intensity exercise.  Maintain a healthy weight  Body mass index (BMI) is a measurement that can be used to identify possible weight problems. It estimates body fat based on height and weight. Your health care provider can help determine your BMI and help you achieve or maintain a healthy weight.  For females 65 years of age and older:   A BMI below 18.5 is considered underweight.  A BMI of 18.5 to 24.9 is normal.  A BMI of 25 to 29.9 is considered overweight.  A BMI of 30 and above is considered obese.  Watch levels of cholesterol and blood lipids  You should  start having your blood tested for lipids and cholesterol at 65 years of age, then have this test every 5 years.  You may need to have your cholesterol levels checked more often if:  Your lipid or cholesterol levels are high.  You are older than 65 years of age.  You are at high risk for heart disease.  CANCER SCREENING   Lung Cancer  Lung cancer screening is recommended for adults 65-20 years old who are at high risk for lung cancer because of a history of smoking.  A yearly low-dose CT scan of the lungs is recommended for people who:  Currently smoke.  Have quit within the past 15 years.  Have at least a 30-pack-year history of smoking. A pack year is smoking an average of one pack of cigarettes a day for 1 year.  Yearly screening should continue until it has been 15 years since you quit.  Yearly screening should stop if you develop a health problem that would prevent you from having lung cancer treatment.  Breast Cancer  Practice breast self-awareness. This means understanding how your breasts normally appear and feel.  It also means doing regular breast self-exams. Let your health care provider know about any changes, no matter how small.  If you are in your 65s or 30s, you should have a clinical breast exam (CBE) by a health care provider every 1-3 years as part of a regular health exam.  If you are 40  or older, have a CBE every year. Also consider having a breast X-ray (mammogram) every year.  If you have a family history of breast cancer, talk to your health care provider about genetic screening.  If you are at high risk for breast cancer, talk to your health care provider about having an MRI and a mammogram every year.  Breast cancer gene (BRCA) assessment is recommended for women who have family members with BRCA-related cancers. BRCA-related cancers include:  Breast.  Ovarian.  Tubal.  Peritoneal cancers.  Results of the assessment will determine the  need for genetic counseling and BRCA1 and BRCA2 testing. Cervical Cancer Your health care provider may recommend that you be screened regularly for cancer of the pelvic organs (ovaries, uterus, and vagina). This screening involves a pelvic examination, including checking for microscopic changes to the surface of your cervix (Pap test). You may be encouraged to have this screening done every 3 years, beginning at age 21.  For women ages 30-65, health care providers may recommend pelvic exams and Pap testing every 3 years, or they may recommend the Pap and pelvic exam, combined with testing for human papilloma virus (HPV), every 5 years. Some types of HPV increase your risk of cervical cancer. Testing for HPV may also be done on women of any age with unclear Pap test results.  Other health care providers may not recommend any screening for nonpregnant women who are considered low risk for pelvic cancer and who do not have symptoms. Ask your health care provider if a screening pelvic exam is right for you.  If you have had past treatment for cervical cancer or a condition that could lead to cancer, you need Pap tests and screening for cancer for at least 20 years after your treatment. If Pap tests have been discontinued, your risk factors (such as having a new sexual partner) need to be reassessed to determine if screening should resume. Some women have medical problems that increase the chance of getting cervical cancer. In these cases, your health care provider may recommend more frequent screening and Pap tests. Colorectal Cancer  This type of cancer can be detected and often prevented.  Routine colorectal cancer screening usually begins at 65 years of age and continues through 65 years of age.  Your health care provider may recommend screening at an earlier age if you have risk factors for colon cancer.  Your health care provider may also recommend using home test kits to check for hidden blood in  the stool.  A small camera at the end of a tube can be used to examine your colon directly (sigmoidoscopy or colonoscopy). This is done to check for the earliest forms of colorectal cancer.  Routine screening usually begins at age 50.  Direct examination of the colon should be repeated every 5-10 years through 65 years of age. However, you may need to be screened more often if early forms of precancerous polyps or small growths are found. Skin Cancer  Check your skin from head to toe regularly.  Tell your health care provider about any new moles or changes in moles, especially if there is a change in a mole's shape or color.  Also tell your health care provider if you have a mole that is larger than the size of a pencil eraser.  Always use sunscreen. Apply sunscreen liberally and repeatedly throughout the day.  Protect yourself by wearing long sleeves, pants, a wide-brimmed hat, and sunglasses whenever you are outside. HEART DISEASE,   DIABETES, AND HIGH BLOOD PRESSURE   High blood pressure causes heart disease and increases the risk of stroke. High blood pressure is more likely to develop in:  People who have blood pressure in the high end of the normal range (130-139/85-89 mm Hg).  People who are overweight or obese.  People who are African American.  If you are 18-39 years of age, have your blood pressure checked every 3-5 years. If you are 40 years of age or older, have your blood pressure checked every year. You should have your blood pressure measured twice--once when you are at a hospital or clinic, and once when you are not at a hospital or clinic. Record the average of the two measurements. To check your blood pressure when you are not at a hospital or clinic, you can use:  An automated blood pressure machine at a pharmacy.  A home blood pressure monitor.  If you are between 55 years and 79 years old, ask your health care provider if you should take aspirin to prevent  strokes.  Have regular diabetes screenings. This involves taking a blood sample to check your fasting blood sugar level.  If you are at a normal weight and have a low risk for diabetes, have this test once every three years after 65 years of age.  If you are overweight and have a high risk for diabetes, consider being tested at a younger age or more often. PREVENTING INFECTION  Hepatitis B  If you have a higher risk for hepatitis B, you should be screened for this virus. You are considered at high risk for hepatitis B if:  You were born in a country where hepatitis B is common. Ask your health care provider which countries are considered high risk.  Your parents were born in a high-risk country, and you have not been immunized against hepatitis B (hepatitis B vaccine).  You have HIV or AIDS.  You use needles to inject street drugs.  You live with someone who has hepatitis B.  You have had sex with someone who has hepatitis B.  You get hemodialysis treatment.  You take certain medicines for conditions, including cancer, organ transplantation, and autoimmune conditions. Hepatitis C  Blood testing is recommended for:  Everyone born from 1945 through 1965.  Anyone with known risk factors for hepatitis C. Sexually transmitted infections (STIs)  You should be screened for sexually transmitted infections (STIs) including gonorrhea and chlamydia if:  You are sexually active and are younger than 65 years of age.  You are older than 65 years of age and your health care provider tells you that you are at risk for this type of infection.  Your sexual activity has changed since you were last screened and you are at an increased risk for chlamydia or gonorrhea. Ask your health care provider if you are at risk.  If you do not have HIV, but are at risk, it may be recommended that you take a prescription medicine daily to prevent HIV infection. This is called pre-exposure prophylaxis  (PrEP). You are considered at risk if:  You are sexually active and do not regularly use condoms or know the HIV status of your partner(s).  You take drugs by injection.  You are sexually active with a partner who has HIV. Talk with your health care provider about whether you are at high risk of being infected with HIV. If you choose to begin PrEP, you should first be tested for HIV. You should then   be tested every 3 months for as long as you are taking PrEP.  PREGNANCY   If you are premenopausal and you may become pregnant, ask your health care provider about preconception counseling.  If you may become pregnant, take 400 to 800 micrograms (mcg) of folic acid every day.  If you want to prevent pregnancy, talk to your health care provider about birth control (contraception). OSTEOPOROSIS AND MENOPAUSE   Osteoporosis is a disease in which the bones lose minerals and strength with aging. This can result in serious bone fractures. Your risk for osteoporosis can be identified using a bone density scan.  If you are 80 years of age or older, or if you are at risk for osteoporosis and fractures, ask your health care provider if you should be screened.  Ask your health care provider whether you should take a calcium or vitamin D supplement to lower your risk for osteoporosis.  Menopause may have certain physical symptoms and risks.  Hormone replacement therapy may reduce some of these symptoms and risks. Talk to your health care provider about whether hormone replacement therapy is right for you.  HOME CARE INSTRUCTIONS   Schedule regular health, dental, and eye exams.  Stay current with your immunizations.   Do not use any tobacco products including cigarettes, chewing tobacco, or electronic cigarettes.  If you are pregnant, do not drink alcohol.  If you are breastfeeding, limit how much and how often you drink alcohol.  Limit alcohol intake to no more than 1 drink per day for  nonpregnant women. One drink equals 12 ounces of beer, 5 ounces of wine, or 1 ounces of hard liquor.  Do not use street drugs.  Do not share needles.  Ask your health care provider for help if you need support or information about quitting drugs.  Tell your health care provider if you often feel depressed.  Tell your health care provider if you have ever been abused or do not feel safe at home.   This information is not intended to replace advice given to you by your health care provider. Make sure you discuss any questions you have with your health care provider.   Document Released: 04/13/2011 Document Revised: 10/19/2014 Document Reviewed: 08/30/2013 Elsevier Interactive Patient Education 2016 [place] protect organs, store calcium, and anchor muscles. Good health habits, such as eating nutritious foods and exercising regularly, are important for maintaining healthy bones. They can also help to prevent a condition that causes bones to lose density and become weak and brittle (osteoporosis). WHY IS BONE MASS IMPORTANT? Bone mass refers to the amount of bone tissue that you have. The higher your bone mass, the stronger your bones. An important step toward having healthy bones throughout life is to have strong and dense bones during childhood. A young adult who has a high bone mass is more likely to have a high bone mass later in life. Bone mass at its greatest it is called peak bone mass. A large decline in bone mass occurs in older adults. In women, it occurs about the time of menopause. During this time, it is important to practice good health habits, because if more bone is lost than what is replaced, the bones will become less healthy and more likely to break (fracture). If you find that you have a low bone mass, you may be able to prevent osteoporosis or further bone loss by changing your diet and lifestyle. HOW CAN I FIND OUT  IF MY BONE MASS IS LOW? Bone mass  can be measured with an X-ray test that is called a bone mineral density (BMD) test. This test is recommended for all women who are age 98 or older. It may also be recommended for men who are age 72 or older, or for people who are more likely to develop osteoporosis due to:  Having bones that break easily.  Having a long-term disease that weakens bones, such as kidney disease or rheumatoid arthritis.  Having menopause earlier than normal.  Taking medicine that weakens bones, such as steroids, thyroid hormones, or hormone treatment for breast cancer or prostate cancer.  Smoking.  Drinking three or more alcoholic drinks each day. WHAT ARE THE NUTRITIONAL RECOMMENDATIONS FOR HEALTHY BONES? To have healthy bones, you need to get enough of the right minerals and vitamins. Most nutrition experts recommend getting these nutrients from the foods that you eat. Nutritional recommendations vary from person to person. Ask your health care provider what is healthy for you. Here are some general guidelines. Calcium Recommendations Calcium is the most important (essential) mineral for bone health. Most people can get enough calcium from their diet, but supplements may be recommended for people who are at risk for osteoporosis. Good sources of calcium include:  Dairy products, such as low-fat or nonfat milk, cheese, and yogurt.  Dark green leafy vegetables, such as bok choy and broccoli.  Calcium-fortified foods, such as orange juice, cereal, bread, soy beverages, and tofu products.  Nuts, such as almonds. Follow these recommended amounts for daily calcium intake:  Children, age 69-3: 700 mg.  Children, age 70-8: 1,000 mg.  Children, age 36-13: 1,300 mg.  Teens, age 97-18: 1,300 mg.  Adults, age 692-50: 1,000 mg.  Adults, age 61-70:  Men: 1,000 mg.  Women: 1,200 mg.  Adults, age 58 or older: 1,200 mg.  Pregnant and breastfeeding females:  Teens: 1,300 mg.  Adults: 1,000 mg. Vitamin D  Recommendations Vitamin D is the most essential vitamin for bone health. It helps the body to absorb calcium. Sunlight stimulates the skin to make vitamin D, so be sure to get enough sunlight. If you live in a cold climate or you do not get outside often, your health care provider may recommend that you take vitamin D supplements. Good sources of vitamin D in your diet include:  Egg yolks.  Saltwater fish.  Milk and cereal fortified with vitamin D. Follow these recommended amounts for daily vitamin D intake:  Children and teens, age 69-18: 600 international units.  Adults, age 21 or younger: 400-800 international units.  Adults, age 697 or older: 800-1,000 international units. Other Nutrients Other nutrients for bone health include:  Phosphorus. This mineral is found in meat, poultry, dairy foods, nuts, and legumes. The recommended daily intake for adult men and adult women is 700 mg.  Magnesium. This mineral is found in seeds, nuts, dark green vegetables, and legumes. The recommended daily intake for adult men is 400-420 mg. For adult women, it is 310-320 mg.  Vitamin K. This vitamin is found in green leafy vegetables. The recommended daily intake is 120 mg for adult men and 90 mg for adult women. WHAT TYPE OF PHYSICAL ACTIVITY IS BEST FOR BUILDING AND MAINTAINING HEALTHY BONES? Weight-bearing and strength-building activities are important for building and maintaining peak bone mass. Weight-bearing activities cause muscles and bones to work against gravity. Strength-building activities increases muscle strength that supports bones. Weight-bearing and muscle-building activities include:  Walking and hiking.  Jogging  and running.  Dancing.  Gym exercises.  Lifting weights.  Tennis and racquetball.  Climbing stairs.  Aerobics. Adults should get at least 30 minutes of moderate physical activity on most days. Children should get at least 60 minutes of moderate physical activity on  most days. Ask your health care provide what type of exercise is best for you. WHERE CAN I FIND MORE INFORMATION? For more information, check out the following websites:  [place]: YardH[employer]se  [employer]: http://www.niams.AnonymousEar.fr.asp   This information is not intended to replace advice given to you by your health care provider. Make sure you discuss any questions you have with your health care provider.   Document Released: 12/19/2003 Document Revised: 02/12/2015 Document Reviewed: 10/03/2014 Elsevier Interactive Patient Education [employer].

## 2016-01-30 ENCOUNTER — Ambulatory Visit (INDEPENDENT_AMBULATORY_CARE_PROVIDER_SITE_OTHER)
Admission: RE | Admit: 2016-01-30 | Discharge: 2016-01-30 | Disposition: A | Discharge status: Home / Self Care | Payer: Medicare Other | Source: Ambulatory Visit | Attending: Internal Medicine | Admitting: Internal Medicine

## 2016-01-30 DIAGNOSIS — E2839 Other primary ovarian failure: Secondary | ICD-10-CM | POA: Diagnosis not present

## 2016-02-02 DIAGNOSIS — R739 Hyperglycemia, unspecified: Secondary | ICD-10-CM | POA: Insufficient documentation

## 2016-04-09 ENCOUNTER — Other Ambulatory Visit: Payer: Self-pay | Admitting: Internal Medicine

## 2016-04-09 DIAGNOSIS — Z1231 Encounter for screening mammogram for malignant neoplasm of breast: Secondary | ICD-10-CM

## 2016-04-16 ENCOUNTER — Ambulatory Visit: Payer: Medicare Other

## 2016-05-05 ENCOUNTER — Ambulatory Visit
Admission: RE | Admit: 2016-05-05 | Discharge: 2016-05-05 | Disposition: A | Discharge status: Home / Self Care | Payer: Medicare Other | Source: Ambulatory Visit | Attending: Internal Medicine | Admitting: Internal Medicine

## 2016-05-05 DIAGNOSIS — Z1231 Encounter for screening mammogram for malignant neoplasm of breast: Secondary | ICD-10-CM | POA: Diagnosis not present

## 2016-05-11 ENCOUNTER — Ambulatory Visit: Payer: Medicare Other

## 2016-05-25 DIAGNOSIS — H2513 Age-related nuclear cataract, bilateral: Secondary | ICD-10-CM | POA: Diagnosis not present

## 2016-07-29 ENCOUNTER — Ambulatory Visit (INDEPENDENT_AMBULATORY_CARE_PROVIDER_SITE_OTHER): Payer: Medicare Other

## 2016-07-29 DIAGNOSIS — Z23 Encounter for immunization: Secondary | ICD-10-CM | POA: Diagnosis not present

## 2017-01-18 LAB — LIPID PANEL
CHOLESTEROL: 167 mg/dL (ref 0–200)
HDL: 80 mg/dL — AB (ref 35–70)
LDL Cholesterol: 74 mg/dL
TRIGLYCERIDES: 64 mg/dL (ref 40–160)

## 2017-01-26 NOTE — Progress Notes (Addendum)
Subjective:   Elizabeth Rollins is a 66 y.o. female who presents for Medicare Annual (Subsequent) preventive examination.  The Patient was informed that the wellness visit is to identify future health risk and educate and initiate measures that can reduce risk for increased disease through the lifespan.    NO ROS; Medicare Wellness Visit  Disabled in 2002    Describes health as good, fair or great? Good   Preventive Screening -Counseling & Management  Colonoscopy 08/2013 -08-2023 due Mammogram 04/2016/ due in July 2018  Dexa; 04.2017  (-1.2) is taking Vit D and Calcium Pap 01/2012 - waived now Needs PSV 23- states she had this at the [place]; will call the office to confirm PSV 23 for the record   Smoking history - never smoked/ quit 1984;  Smokeless tobacco  Second Hand Smoke status; No Smokers in the home ETOH -no  Medication adherence or issues? no  RISK FACTORS Diet Diet is very good Vegetables; fruits Drinks a lot of water Cooks most of the time;  Eats out on the weekend   BMI  22;   Regular exercise  also walks some Noted to be doing water Aerobics x 3 to 4 times per week  [place] 2002    Cardiac Risk Factors:  Advanced aged > 1 in men; >65 in women Hyperlipidemia - chol 171; HDL 65; trig 46; LDL 76 Diabetes - 5.9 / bs 95; down from 6.0 Family History - Mother had PE; HTN; Father had MI, DM  And HD Diabetes in siblings Obesity BMI 22  Fall risk:  Given education on "Fall Prevention in the Home" for more safety tips the patient can apply as appropriate.  Long term goal is to "age in place" or undecided    Mobility of Functional changes this year? no   Mental Health:  Any emotional problems? Anxious, depressed, irritable, sad or blue? Life happens but not in the last 2 weeks Denies feeling depressed or hopeless; voices pleasure in daily life How many social activities have you been engaged in within the last 2 weeks? No   Hearing Screening Comments: Hearing  issues No  Vision Screening Comments: Vision checks  Every August Has cataracts and will have this done this year No issues Dr. Richardean Sale   Activities of Daily Living - See functional screen     Cognitive testing; Ad8 score; 0 or less than 2  MMSE deferred or completed if AD8 + 2 issues  Advanced Directives has not completed but did agreed to take [place]'s form   Lottie Dawson, MD   Immunization History  Administered Date(s) Administered  . Influenza, High Dose Seasonal PF 07/29/2016  . Influenza,inj,Quad PF,36+ Mos 09/06/2013, 08/20/2015  . Pneumococcal Conjugate-13 01/27/2016  . Td 02/24/2010  . Zoster 04/21/2011   Required Immunizations needed today  Screening test up to date or reviewed for plan of completion Health Maintenance Due  Topic Date Due  . PNA vac Low Risk Adult (2 of 2 - PPSV23) 01/26/2017   Taken at the [place] this year during her annual exam; will call to verify. Cardiac Risk Factors include: advanced age (>15men, >7 women)     Objective:     Vitals: BP 118/70   Pulse 72   Ht 5\' 7"  (1.702 m)   Wt 141 lb 5 oz (64.1 kg)   SpO2 98%   BMI 22.13 kg/m   Body mass index is 22.13 kg/m.   Tobacco History  Smoking Status  . Former  Smoker  . Quit date: 10/12/1982  Smokeless Tobacco  . Never Used    Comment: smoked many years ago and can';t qunatify      Counseling given: Yes   Past Medical History:  Diagnosis Date  . Enlarged uterus 2-02   TAH & BSO  . Miscarriage   . Varicella    hx as a child   Past Surgical History:  Procedure Laterality Date  . ABDOMINAL HYSTERECTOMY  2-02   TAH and BSO for nonmalignant reasons bleeding  . BREAST SURGERY     Lumpectomy-Benign  . LAMINECTOMY  2002   and repeat  . LUMBAR DISC SURGERY     x 2  . OOPHORECTOMY     BSO   Family History  Problem Relation Age of Onset  . Pulmonary embolism Mother     died 66  . Hypertension Mother   . Heart attack Father     died 67  . Diabetes Father    . Heart disease Father   . Allergies Sister   . Diabetes Brother   . Diabetes Brother   . Diabetes Sister   . Other Sister     Congestive Heart Failure  . Diabetes Sister   . Colon cancer Neg Hx    History  Sexual Activity  . Sexual activity: Yes  . Birth control/ protection: Surgical    Comment: declined insurance questions    Outpatient Encounter Prescriptions as of 01/27/2017  Medication Sig  . CALCIUM PO Take by mouth.  . Cholecalciferol (VITAMIN D) 2000 UNITS tablet Take 2,000 Units by mouth daily.  . fish oil-omega-3 fatty acids 1000 MG capsule Take 2 g by mouth daily.    [name] MAGNESIUM PO Take 500 mg by mouth 2 (two) times daily.  . meloxicam (MOBIC) 15 MG tablet take 1/2 tablet to 1 tablet by mouth daily  . Probiotic Product (PROBIOTIC PO) Take by mouth. Reported on 01/27/2016   No facility-administered encounter medications on file as of 01/27/2017.     Activities of Daily Living In your present state of health, do you have any difficulty performing the following activities: 01/27/2017  Hearing? N  Vision? N  Difficulty concentrating or making decisions? N  Walking or climbing stairs? N  Dressing or bathing? N  Doing errands, shopping? N  Preparing Food and eating ? N  Using the Toilet? N  In the past six months, have you accidently leaked urine? N  Do you have problems with loss of bowel control? N  Managing your Medications? N  Managing your Finances? N  Housekeeping or managing your Housekeeping? N  Some recent data might be hidden    Patient Care Team: Burnis Medin, MD as PCP - General Huel Cote, NP as Nurse Practitioner (Obstetrics and Gynecology)    Assessment:     Exercise Activities and Dietary recommendations Current Exercise Habits: Structured exercise class, Time (Minutes): 60, Frequency (Times/Week): 4, Weekly Exercise (Minutes/Week): 240, Intensity: Moderate  Goals    . patient          To maintain health       Fall Risk Fall  Risk  01/27/2017 01/27/2016 01/22/2015  Falls in the past year? No No No   Depression Screen PHQ 2/9 Scores 01/27/2017 01/27/2016 01/22/2015  PHQ - 2 Score 0 0 0     Cognitive Function MMSE - Mini Mental State Exam 01/27/2017  Not completed: (No Data)    no issues noted at present;  Immunization History  Administered Date(s) Administered  . Influenza, High Dose Seasonal PF 07/29/2016  . Influenza,inj,Quad PF,36+ Mos 09/06/2013, 08/20/2015  . Pneumococcal Conjugate-13 01/27/2016  . Td 02/24/2010  . Zoster 04/21/2011   Screening Tests Health Maintenance  Topic Date Due  . PNA vac Low Risk Adult (2 of 2 - PPSV23) 01/26/2017  . INFLUENZA VACCINE  05/12/2017  . MAMMOGRAM  05/05/2018  . TETANUS/TDAP  02/25/2020  . COLONOSCOPY  08/30/2023  . DEXA SCAN  Completed  . Hepatitis C Screening  Completed      Plan:    PCP Notes  Health Maintenance Will review information on the shingrix and take it at the pharmacy. Will call to get rx as needed  [facility] Screens [facility] Asked her to bring this in when she come by this way and put it to my attention to abstract.  States her A1c was not drawn and due to her pre-diabetic ranges; ordered under her wellness exam for screening     Referrals none at this time Patient concerns;none at this time  Nurse Concerns; Will bring over her lipids and other labs the [facility] completed to my attention  Next PCP apt to be scheduled as needed but do need blood work and copy of meds on her visit summary     During the course of the visit the patient was educated and counseled about the following appropriate screening and preventive services:   Vaccines to include Pneumoccal, Influenza, Hepatitis B, Td, Zostavax, HCV  Electrocardiogram  Cardiovascular Disease  Colorectal cancer screening  Bone density screening taking Vit 3 and calcium;  Diabetes screening  Glaucoma screening  Mammography/PAP  Nutrition  counseling   Patient Instructions (the written plan) was given to the patient.   Wynetta Fines, RN  01/27/2017   Agree with above reviewed Lottie Dawson, MD

## 2017-01-27 ENCOUNTER — Telehealth: Payer: Self-pay

## 2017-01-27 ENCOUNTER — Ambulatory Visit (INDEPENDENT_AMBULATORY_CARE_PROVIDER_SITE_OTHER): Payer: Medicare Other

## 2017-01-27 VITALS — BP 118/70 | HR 72 | Ht 67.0 in | Wt 141.3 lb

## 2017-01-27 DIAGNOSIS — Z0000 Encounter for general adult medical examination without abnormal findings: Secondary | ICD-10-CM | POA: Diagnosis not present

## 2017-01-27 DIAGNOSIS — R7301 Impaired fasting glucose: Secondary | ICD-10-CM

## 2017-01-27 LAB — HEMOGLOBIN A1C: Hgb A1c MFr Bld: 5.8 % (ref 4.6–6.5)

## 2017-01-27 NOTE — Telephone Encounter (Signed)
Elizabeth Rollins in for AWV 01/27/17  Noted her PSV 23 was due but states she had physical at the [place] earlier this month and they just gave her a "second' pneumonia  Called post visit to confirm she  had the PCV13 on 01/18/2017 She had PCV 13 documented in the office on 01/27/2016  Wanted to know when she would fup with the psv 23;  Will recommend 1 year and to get a copy of her imm and labs from the [place]. She will let the [facility] know she has had the PCV 13 x 2;    A1c 5.8 today and was reported to her  Please advise if ok or other.

## 2017-01-27 NOTE — Telephone Encounter (Signed)
Hg a1c is  Good  And no  Intervention except healthy eating and  Exercise as possible.   Thanks for updated  Info. Any labs done at [facility] would also be helpful.

## 2017-01-27 NOTE — Progress Notes (Signed)
Call to Elizabeth Rollins and fup regarding her A1c drawn today. Requested a letter and letter sent

## 2017-01-27 NOTE — Patient Instructions (Addendum)
Elizabeth Rollins , Thank you for taking time to come for your Medicare Wellness Visit. I appreciate your ongoing commitment to your health goals. Please review the following plan we discussed and let me know if I can assist you in the future.   Educated regarding prediabetes and numbers;  A1c ranges from 5.8 to 6.5 or fasting Blood sugar > 115 -126; (126 is diabetic)   Risk: >66yo; family hx; overweight or obese; African American; Hispanic; Latino; American [place] American; [place]; history of diabetes when pregnant; or birth to a baby weighing over 9 lbs. Being less physically active than 30 minutes; 3 times a week;   Prevention; Losing a modest 7 to 8 lbs; If over 200 lbs; 10 to 14 lbs;  Choose healthier foods; colorful veggies; fish or lean meats; drinks water Reduce portion size Start exercising; 30 minutes of fast walking x 30 minutes per day/ 60 min for weight loss   Can take the shingrix at the pharmacy Your doctor will write the order for your pharmacy of choice It is 2 vaccine series  You can check with the CDC.gov for more information    These are the goals we discussed: Goals    . patient          To maintain health        This is a list of the screening recommended for you and due dates:  Health Maintenance  Topic Date Due  . Pneumonia vaccines (2 of 2 - PPSV23) 01/26/2017  . Flu Shot  05/12/2017  . Mammogram  05/05/2018  . Tetanus Vaccine  02/25/2020  . Colon Cancer Screening  08/30/2023  . DEXA scan (bone density measurement)  Completed  .  Hepatitis C: One time screening is recommended by Center for Disease Control  (CDC) for  adults born from [birth year] through 1965.   Completed    Prevention of falls: Remove rugs or any tripping hazards in the home Use Non slip mats in bathtubs and showers Placing grab bars next to the toilet and or shower Placing handrails on both sides of the stair way Adding extra lighting in the home.   Personal safety issues  reviewed:  1. Consider starting a community watch program per [facility] 2.  Changes batteries is smoke detector and/or carbon monoxide detector  3.  If you have firearms; keep them in a safe place 4.  Wear protection when in the sun; Always wear sunscreen or a hat; It is good to have your doctor check your skin annually or review any new areas of concern 5. Driving safety; Keep in the right lane; stay 3 car lengths behind the car in front of you on the highway; look 3 times prior to pulling out; carry your cell phone everywhere you go!    Learn about the Yellow Dot program:  The program allows first responders at your emergency to have access to who your physician is, as well as your medications and medical conditions.  Citizens requesting the Yellow Dot Packages should contact Master Corporal Nunzio Cobbs at the [facility] [phone] for the first week of the program and beginning the week after Easter citizens should contact their Scientist, physiological.          Fall Prevention in the Home Falls can cause injuries. They can happen to people of all ages. There are many things you can do to make your home safe and to help prevent falls. What can  I do on the outside of my home?  Regularly fix the edges of walkways and driveways and fix any cracks.  Remove anything that might make you trip as you walk through a door, such as a raised step or threshold.  Trim any bushes or trees on the path to your home.  Use bright outdoor lighting.  Clear any walking paths of anything that might make someone trip, such as rocks or tools.  Regularly check to see if handrails are loose or broken. Make sure that both sides of any steps have handrails.  Any raised decks and porches should have guardrails on the edges.  Have any leaves, snow, or ice cleared regularly.  Use sand or salt on walking paths during winter.  Clean up any spills in your garage  right away. This includes oil or grease spills. What can I do in the bathroom?  Use night lights.  Install grab bars by the toilet and in the tub and shower. Do not use towel bars as grab bars.  Use non-skid mats or decals in the tub or shower.  If you need to sit down in the shower, use a plastic, non-slip stool.  Keep the floor dry. Clean up any water that spills on the floor as soon as it happens.  Remove soap buildup in the tub or shower regularly.  Attach bath mats securely with double-sided non-slip rug tape.  Do not have throw rugs and other things on the floor that can make you trip. What can I do in the bedroom?  Use night lights.  Make sure that you have a light by your bed that is easy to reach.  Do not use any sheets or blankets that are too big for your bed. They should not hang down onto the floor.  Have a firm chair that has side arms. You can use this for support while you get dressed.  Do not have throw rugs and other things on the floor that can make you trip. What can I do in the kitchen?  Clean up any spills right away.  Avoid walking on wet floors.  Keep items that you use a lot in easy-to-reach places.  If you need to reach something above you, use a strong step stool that has a grab bar.  Keep electrical cords out of the way.  Do not use floor polish or wax that makes floors slippery. If you must use wax, use non-skid floor wax.  Do not have throw rugs and other things on the floor that can make you trip. What can I do with my stairs?  Do not leave any items on the stairs.  Make sure that there are handrails on both sides of the stairs and use them. Fix handrails that are broken or loose. Make sure that handrails are as long as the stairways.  Check any carpeting to make sure that it is firmly attached to the stairs. Fix any carpet that is loose or worn.  Avoid having throw rugs at the top or bottom of the stairs. If you do have throw rugs,  attach them to the floor with carpet tape.  Make sure that you have a light switch at the top of the stairs and the bottom of the stairs. If you do not have them, ask someone to add them for you. What else can I do to help prevent falls?  Wear shoes that:  Do not have high heels.  Have rubber bottoms.  Are  comfortable and fit you well.  Are closed at the toe. Do not wear sandals.  If you use a stepladder:  Make sure that it is fully opened. Do not climb a closed stepladder.  Make sure that both sides of the stepladder are locked into place.  Ask someone to hold it for you, if possible.  Clearly mark and make sure that you can see:  Any grab bars or handrails.  First and last steps.  Where the edge of each step is.  Use tools that help you move around (mobility aids) if they are needed. These include:  Canes.  Walkers.  Scooters.  Crutches.  Turn on the lights when you go into a dark area. Replace any light bulbs as soon as they burn out.  Set up your furniture so you have a clear path. Avoid moving your furniture around.  If any of your floors are uneven, fix them.  If there are any pets around you, be aware of where they are.  Review your medicines with your doctor. Some medicines can make you feel dizzy. This can increase your chance of falling. Ask your doctor what other things that you can do to help prevent falls. This information is not intended to replace advice given to you by your health care provider. Make sure you discuss any questions you have with your health care provider. Document Released: 07/25/2009 Document Revised: 03/05/2016 Document Reviewed: 11/02/2014 Elsevier Interactive Patient Education  2017 [place] Maintenance, Female Adopting a healthy lifestyle and getting preventive care can go a long way to promote health and wellness. Talk with your health care provider about what schedule of regular examinations is right for you.  This is a good chance for you to check in with your provider about disease prevention and staying healthy. In between checkups, there are plenty of things you can do on your own. Experts have done a lot of research about which lifestyle changes and preventive measures are most likely to keep you healthy. Ask your health care provider for more information. Weight and diet Eat a healthy diet  Be sure to include plenty of vegetables, fruits, low-fat dairy products, and lean protein.  Do not eat a lot of foods high in solid fats, added sugars, or salt.  Get regular exercise. This is one of the most important things you can do for your health.  Most adults should exercise for at least 150 minutes each week. The exercise should increase your heart rate and make you sweat (moderate-intensity exercise).  Most adults should also do strengthening exercises at least twice a week. This is in addition to the moderate-intensity exercise. Maintain a healthy weight  Body mass index (BMI) is a measurement that can be used to identify possible weight problems. It estimates body fat based on height and weight. Your health care provider can help determine your BMI and help you achieve or maintain a healthy weight.  For females 71 years of age and older:  A BMI below 18.5 is considered underweight.  A BMI of 18.5 to 24.9 is normal.  A BMI of 25 to 29.9 is considered overweight.  A BMI of 30 and above is considered obese. Watch levels of cholesterol and blood lipids  You should start having your blood tested for lipids and cholesterol at 66 years of age, then have this test every 5 years.  You may need to have your cholesterol levels checked more often if:  Your lipid or cholesterol  levels are high.  You are older than 66 years of age.  You are at high risk for heart disease. Cancer screening Lung Cancer  Lung cancer screening is recommended for adults 43-70 years old who are at high risk for lung  cancer because of a history of smoking.  A yearly low-dose CT scan of the lungs is recommended for people who:  Currently smoke.  Have quit within the past 15 years.  Have at least a 30-pack-year history of smoking. A pack year is smoking an average of one pack of cigarettes a day for 1 year.  Yearly screening should continue until it has been 15 years since you quit.  Yearly screening should stop if you develop a health problem that would prevent you from having lung cancer treatment. Breast Cancer  Practice breast self-awareness. This means understanding how your breasts normally appear and feel.  It also means doing regular breast self-exams. Let your health care provider know about any changes, no matter how small.  If you are in your 20s or 30s, you should have a clinical breast exam (CBE) by a health care provider every 1-3 years as part of a regular health exam.  If you are 45 or older, have a CBE every year. Also consider having a breast X-ray (mammogram) every year.  If you have a family history of breast cancer, talk to your health care provider about genetic screening.  If you are at high risk for breast cancer, talk to your health care provider about having an MRI and a mammogram every year.  Breast cancer gene (BRCA) assessment is recommended for women who have family members with BRCA-related cancers. BRCA-related cancers include:  Breast.  Ovarian.  Tubal.  Peritoneal cancers.  Results of the assessment will determine the need for genetic counseling and BRCA1 and BRCA2 testing. Cervical Cancer  Your health care provider may recommend that you be screened regularly for cancer of the pelvic organs (ovaries, uterus, and vagina). This screening involves a pelvic examination, including checking for microscopic changes to the surface of your cervix (Pap test). You may be encouraged to have this screening done every 3 years, beginning at age 16.  For women ages 67-65,  health care providers may recommend pelvic exams and Pap testing every 3 years, or they may recommend the Pap and pelvic exam, combined with testing for human papilloma virus (HPV), every 5 years. Some types of HPV increase your risk of cervical cancer. Testing for HPV may also be done on women of any age with unclear Pap test results.  Other health care providers may not recommend any screening for nonpregnant women who are considered low risk for pelvic cancer and who do not have symptoms. Ask your health care provider if a screening pelvic exam is right for you.  If you have had past treatment for cervical cancer or a condition that could lead to cancer, you need Pap tests and screening for cancer for at least 20 years after your treatment. If Pap tests have been discontinued, your risk factors (such as having a new sexual partner) need to be reassessed to determine if screening should resume. Some women have medical problems that increase the chance of getting cervical cancer. In these cases, your health care provider may recommend more frequent screening and Pap tests. Colorectal Cancer  This type of cancer can be detected and often prevented.  Routine colorectal cancer screening usually begins at 66 years of age and continues through 66 years  of age.  Your health care provider may recommend screening at an earlier age if you have risk factors for colon cancer.  Your health care provider may also recommend using home test kits to check for hidden blood in the stool.  A small camera at the end of a tube can be used to examine your colon directly (sigmoidoscopy or colonoscopy). This is done to check for the earliest forms of colorectal cancer.  Routine screening usually begins at age 62.  Direct examination of the colon should be repeated every 5-10 years through 66 years of age. However, you may need to be screened more often if early forms of precancerous polyps or small growths are  found. Skin Cancer  Check your skin from head to toe regularly.  Tell your health care provider about any new moles or changes in moles, especially if there is a change in a mole's shape or color.  Also tell your health care provider if you have a mole that is larger than the size of a pencil eraser.  Always use sunscreen. Apply sunscreen liberally and repeatedly throughout the day.  Protect yourself by wearing long sleeves, pants, a wide-brimmed hat, and sunglasses whenever you are outside. Heart disease, diabetes, and high blood pressure  High blood pressure causes heart disease and increases the risk of stroke. High blood pressure is more likely to develop in:  People who have blood pressure in the high end of the normal range (130-139/85-89 mm Hg).  People who are overweight or obese.  People who are African American.  If you are 49-96 years of age, have your blood pressure checked every 3-5 years. If you are 55 years of age or older, have your blood pressure checked every year. You should have your blood pressure measured twice-once when you are at a hospital or clinic, and once when you are not at a hospital or clinic. Record the average of the two measurements. To check your blood pressure when you are not at a hospital or clinic, you can use:  An automated blood pressure machine at a pharmacy.  A home blood pressure monitor.  If you are between 23 years and 80 years old, ask your health care provider if you should take aspirin to prevent strokes.  Have regular diabetes screenings. This involves taking a blood sample to check your fasting blood sugar level.  If you are at a normal weight and have a low risk for diabetes, have this test once every three years after 66 years of age.  If you are overweight and have a high risk for diabetes, consider being tested at a younger age or more often. Preventing infection Hepatitis B  If you have a higher risk for hepatitis B, you  should be screened for this virus. You are considered at high risk for hepatitis B if:  You were born in a country where hepatitis B is common. Ask your health care provider which countries are considered high risk.  Your parents were born in a high-risk country, and you have not been immunized against hepatitis B (hepatitis B vaccine).  You have HIV or AIDS.  You use needles to inject street drugs.  You live with someone who has hepatitis B.  You have had sex with someone who has hepatitis B.  You get hemodialysis treatment.  You take certain medicines for conditions, including cancer, organ transplantation, and autoimmune conditions. Hepatitis C  Blood testing is recommended for:  Everyone born from [birth year] through  [place] with known risk factors for hepatitis C. Sexually transmitted infections (STIs)  You should be screened for sexually transmitted infections (STIs) including gonorrhea and chlamydia if:  You are sexually active and are younger than 66 years of age.  You are older than 65 years of age and your health care provider tells you that you are at risk for this type of infection.  Your sexual activity has changed since you were last screened and you are at an increased risk for chlamydia or gonorrhea. Ask your health care provider if you are at risk.  If you do not have HIV, but are at risk, it may be recommended that you take a prescription medicine daily to prevent HIV infection. This is called pre-exposure prophylaxis (PrEP). You are considered at risk if:  You are sexually active and do not regularly use condoms or know the HIV status of your partner(s).  You take drugs by injection.  You are sexually active with a partner who has HIV. Talk with your health care provider about whether you are at high risk of being infected with HIV. If you choose to begin PrEP, you should first be tested for HIV. You should then be tested every 3 months for as long as you  are taking PrEP. Pregnancy  If you are premenopausal and you may become pregnant, ask your health care provider about preconception counseling.  If you may become pregnant, take 400 to 800 micrograms (mcg) of folic acid every day.  If you want to prevent pregnancy, talk to your health care provider about birth control (contraception). Osteoporosis and menopause  Osteoporosis is a disease in which the bones lose minerals and strength with aging. This can result in serious bone fractures. Your risk for osteoporosis can be identified using a bone density scan.  If you are 42 years of age or older, or if you are at risk for osteoporosis and fractures, ask your health care provider if you should be screened.  Ask your health care provider whether you should take a calcium or vitamin D supplement to lower your risk for osteoporosis.  Menopause may have certain physical symptoms and risks.  Hormone replacement therapy may reduce some of these symptoms and risks. Talk to your health care provider about whether hormone replacement therapy is right for you. Follow these instructions at home:  Schedule regular health, dental, and eye exams.  Stay current with your immunizations.  Do not use any tobacco products including cigarettes, chewing tobacco, or electronic cigarettes.  If you are pregnant, do not drink alcohol.  If you are breastfeeding, limit how much and how often you drink alcohol.  Limit alcohol intake to no more than 1 drink per day for nonpregnant women. One drink equals 12 ounces of beer, 5 ounces of wine, or 1 ounces of hard liquor.  Do not use street drugs.  Do not share needles.  Ask your health care provider for help if you need support or information about quitting drugs.  Tell your health care provider if you often feel depressed.  Tell your health care provider if you have ever been abused or do not feel safe at home. This information is not intended to replace  advice given to you by your health care provider. Make sure you discuss any questions you have with your health care provider. Document Released: 04/13/2011 Document Revised: 03/05/2016 Document Reviewed: 07/02/2015 Elsevier Interactive Patient Education  2017 [employer].

## 2017-01-29 NOTE — Progress Notes (Unsigned)
The patient copied [facility] report on lab 01/18/2017 Chol 167; Trig 64; HDL 80 and LDL 74 Hep c was negative  Referred to podiatrist for flat foot with some OA.  Dr. Elza Rafter MD   fup 01/19/2017 Bilateral foot pain; xray Findings Pes planus; Mild first MTP joint degenerative changes Small calcaneal enthesophytes; no acute fx or traumatic malalignment; Degenerative changes in the 3 and 4th DIP joints   Copy given to Dr. Peggye Ley to review and then will scan to chart

## 2017-02-02 ENCOUNTER — Encounter: Payer: Medicare Other | Admitting: Internal Medicine

## 2017-03-30 ENCOUNTER — Other Ambulatory Visit: Payer: Self-pay | Admitting: Internal Medicine

## 2017-03-30 DIAGNOSIS — Z1231 Encounter for screening mammogram for malignant neoplasm of breast: Secondary | ICD-10-CM

## 2017-05-06 ENCOUNTER — Ambulatory Visit
Admission: RE | Admit: 2017-05-06 | Discharge: 2017-05-06 | Disposition: A | Discharge status: Home / Self Care | Payer: Medicare Other | Source: Ambulatory Visit | Attending: Internal Medicine | Admitting: Internal Medicine

## 2017-05-06 DIAGNOSIS — Z1231 Encounter for screening mammogram for malignant neoplasm of breast: Secondary | ICD-10-CM | POA: Diagnosis not present

## 2017-05-11 ENCOUNTER — Ambulatory Visit: Payer: 59 | Admitting: Women's Health

## 2017-05-27 DIAGNOSIS — H5000 Unspecified esotropia: Secondary | ICD-10-CM | POA: Diagnosis not present

## 2017-05-27 DIAGNOSIS — H2513 Age-related nuclear cataract, bilateral: Secondary | ICD-10-CM | POA: Diagnosis not present

## 2017-06-02 DIAGNOSIS — H2513 Age-related nuclear cataract, bilateral: Secondary | ICD-10-CM | POA: Diagnosis not present

## 2017-06-09 DIAGNOSIS — H2512 Age-related nuclear cataract, left eye: Secondary | ICD-10-CM | POA: Diagnosis not present

## 2017-06-09 DIAGNOSIS — H2511 Age-related nuclear cataract, right eye: Secondary | ICD-10-CM | POA: Diagnosis not present

## 2017-06-12 HISTORY — PX: OTHER SURGICAL HISTORY: SHX169

## 2017-06-16 DIAGNOSIS — H2512 Age-related nuclear cataract, left eye: Secondary | ICD-10-CM | POA: Diagnosis not present

## 2017-06-28 ENCOUNTER — Encounter: Payer: Self-pay | Admitting: Women's Health

## 2017-06-28 ENCOUNTER — Ambulatory Visit (INDEPENDENT_AMBULATORY_CARE_PROVIDER_SITE_OTHER): Payer: Medicare Other | Admitting: Women's Health

## 2017-06-28 VITALS — BP 116/72 | Ht 66.5 in | Wt 138.0 lb

## 2017-06-28 DIAGNOSIS — B9689 Other specified bacterial agents as the cause of diseases classified elsewhere: Secondary | ICD-10-CM

## 2017-06-28 DIAGNOSIS — N760 Acute vaginitis: Secondary | ICD-10-CM

## 2017-06-28 DIAGNOSIS — Z01411 Encounter for gynecological examination (general) (routine) with abnormal findings: Secondary | ICD-10-CM | POA: Diagnosis not present

## 2017-06-28 DIAGNOSIS — Z23 Encounter for immunization: Secondary | ICD-10-CM

## 2017-06-28 DIAGNOSIS — B373 Candidiasis of vulva and vagina: Secondary | ICD-10-CM

## 2017-06-28 DIAGNOSIS — B3731 Acute candidiasis of vulva and vagina: Secondary | ICD-10-CM

## 2017-06-28 LAB — WET PREP FOR TRICH, YEAST, CLUE

## 2017-06-28 MED ORDER — TERCONAZOLE 0.4 % VA CREA: 1.0000 | TOPICAL_CREAM | Freq: Every day | VAGINAL | 1 refills | Status: DC

## 2017-06-28 MED ORDER — FLUCONAZOLE 150 MG PO TABS: 150.0000 mg | ORAL_TABLET | Freq: Once | ORAL | 3 refills | Status: AC

## 2017-06-28 MED ORDER — METRONIDAZOLE 500 MG PO TABS: 500.0000 mg | ORAL_TABLET | Freq: Two times a day (BID) | ORAL | 0 refills | Status: DC

## 2017-06-28 NOTE — Patient Instructions (Addendum)
Bacterial Vaginosis Bacterial vaginosis is an infection of the vagina. It happens when too many germs (bacteria) grow in the vagina. This infection puts you at risk for infections from sex (STIs). Treating this infection can lower your risk for some STIs. You should also treat this if you are pregnant. It can cause your baby to be born early. Follow these instructions at home: Medicines  Take over-the-counter and prescription medicines only as told by your doctor.  Take or use your antibiotic medicine as told by your doctor. Do not stop taking or using it even if you start to feel better. General instructions  If you your sexual partner is a woman, tell her that you have this infection. She needs to get treatment if she has symptoms. If you have a female partner, he does not need to be treated.  During treatment: ? Avoid sex. ? Do not douche. ? Avoid alcohol as told. ? Avoid breastfeeding as told.  Drink enough fluid to keep your pee (urine) clear or pale yellow.  Keep your vagina and butt (rectum) clean. ? Wash the area with warm water every day. ? Wipe from front to back after you use the toilet.  Keep all follow-up visits as told by your doctor. This is important. Preventing this condition  Do not douche.  Use only warm water to wash around your vagina.  Use protection when you have sex. This includes: ? Latex condoms. ? Dental dams.  Limit how many people you have sex with. It is best to only have sex with the same person (be monogamous).  Get tested for STIs. Have your partner get tested.  Wear underwear that is cotton or lined with cotton.  Avoid tight pants and pantyhose. This is most important in summer.  Do not use any products that have nicotine or tobacco in them. These include cigarettes and e-cigarettes. If you need help quitting, ask your doctor.  Do not use illegal drugs.  Limit how much alcohol you drink. Contact a doctor if:  Your symptoms do not get  better, even after you are treated.  You have more discharge or pain when you pee (urinate).  You have a fever.  You have pain in your belly (abdomen).  You have pain with sex.  Your bleed from your vagina between periods. Summary  This infection happens when too many germs (bacteria) grow in the vagina.  Treating this condition can lower your risk for some infections from sex (STIs).  You should also treat this if you are pregnant. It can cause early (premature) birth.  Do not stop taking or using your antibiotic medicine even if you start to feel better. This information is not intended to replace advice given to you by your health care provider. Make sure you discuss any questions you have with your health care provider. Document Released: 07/07/2008 Document Revised: 06/13/2016 Document Reviewed: 06/13/2016 Elsevier Interactive Patient Education  2017 [place] Maintenance for Postmenopausal Women Menopause is a normal process in which your reproductive ability comes to an end. This process happens gradually over a span of months to years, usually between the ages of 23 and 38. Menopause is complete when you have missed 12 consecutive menstrual periods. It is important to talk with your health care provider about some of the most common conditions that affect postmenopausal women, such as heart disease, cancer, and bone loss (osteoporosis). Adopting a healthy lifestyle and getting preventive care can help to promote your health  and wellness. Those actions can also lower your chances of developing some of these common conditions. What should I know about menopause? During menopause, you may experience a number of symptoms, such as:  Moderate-to-severe hot flashes.  Night sweats.  Decrease in sex drive.  Mood swings.  Headaches.  Tiredness.  Irritability.  Memory problems.  Insomnia.  Choosing to treat or not to treat menopausal changes is an  individual decision that you make with your health care provider. What should I know about hormone replacement therapy and supplements? Hormone therapy products are effective for treating symptoms that are associated with menopause, such as hot flashes and night sweats. Hormone replacement carries certain risks, especially as you become older. If you are thinking about using estrogen or estrogen with progestin treatments, discuss the benefits and risks with your health care provider. What should I know about heart disease and stroke? Heart disease, heart attack, and stroke become more likely as you age. This may be due, in part, to the hormonal changes that your body experiences during menopause. These can affect how your body processes dietary fats, triglycerides, and cholesterol. Heart attack and stroke are both medical emergencies. There are many things that you can do to help prevent heart disease and stroke:  Have your blood pressure checked at least every 1-2 years. High blood pressure causes heart disease and increases the risk of stroke.  If you are 68-42 years old, ask your health care provider if you should take aspirin to prevent a heart attack or a stroke.  Do not use any tobacco products, including cigarettes, chewing tobacco, or electronic cigarettes. If you need help quitting, ask your health care provider.  It is important to eat a healthy diet and maintain a healthy weight. ? Be sure to include plenty of vegetables, fruits, low-fat dairy products, and lean protein. ? Avoid eating foods that are high in solid fats, added sugars, or salt (sodium).  Get regular exercise. This is one of the most important things that you can do for your health. ? Try to exercise for at least 150 minutes each week. The type of exercise that you do should increase your heart rate and make you sweat. This is known as moderate-intensity exercise. ? Try to do strengthening exercises at least twice each  week. Do these in addition to the moderate-intensity exercise.  Know your numbers.Ask your health care provider to check your cholesterol and your blood glucose. Continue to have your blood tested as directed by your health care provider.  What should I know about cancer screening? There are several types of cancer. Take the following steps to reduce your risk and to catch any cancer development as early as possible. Breast Cancer  Practice breast self-awareness. ? This means understanding how your breasts normally appear and feel. ? It also means doing regular breast self-exams. Let your health care provider know about any changes, no matter how small.  If you are 50 or older, have a clinician do a breast exam (clinical breast exam or CBE) every year. Depending on your age, family history, and medical history, it may be recommended that you also have a yearly breast X-ray (mammogram).  If you have a family history of breast cancer, talk with your health care provider about genetic screening.  If you are at high risk for breast cancer, talk with your health care provider about having an MRI and a mammogram every year.  Breast cancer (BRCA) gene test is recommended for women  who have family members with BRCA-related cancers. Results of the assessment will determine the need for genetic counseling and BRCA1 and for BRCA2 testing. BRCA-related cancers include these types: ? Breast. This occurs in males or females. ? Ovarian. ? Tubal. This may also be called fallopian tube cancer. ? Cancer of the abdominal or pelvic lining (peritoneal cancer). ? Prostate. ? Pancreatic.  Cervical, Uterine, and Ovarian Cancer Your health care provider may recommend that you be screened regularly for cancer of the pelvic organs. These include your ovaries, uterus, and vagina. This screening involves a pelvic exam, which includes checking for microscopic changes to the surface of your cervix (Pap test).  For  women ages 21-65, health care providers may recommend a pelvic exam and a Pap test every three years. For women ages 64-65, they may recommend the Pap test and pelvic exam, combined with testing for human papilloma virus (HPV), every five years. Some types of HPV increase your risk of cervical cancer. Testing for HPV may also be done on women of any age who have unclear Pap test results.  Other health care providers may not recommend any screening for nonpregnant women who are considered low risk for pelvic cancer and have no symptoms. Ask your health care provider if a screening pelvic exam is right for you.  If you have had past treatment for cervical cancer or a condition that could lead to cancer, you need Pap tests and screening for cancer for at least 20 years after your treatment. If Pap tests have been discontinued for you, your risk factors (such as having a new sexual partner) need to be reassessed to determine if you should start having screenings again. Some women have medical problems that increase the chance of getting cervical cancer. In these cases, your health care provider may recommend that you have screening and Pap tests more often.  If you have a family history of uterine cancer or ovarian cancer, talk with your health care provider about genetic screening.  If you have vaginal bleeding after reaching menopause, tell your health care provider.  There are currently no reliable tests available to screen for ovarian cancer.  Lung Cancer Lung cancer screening is recommended for adults 50-32 years old who are at high risk for lung cancer because of a history of smoking. A yearly low-dose CT scan of the lungs is recommended if you:  Currently smoke.  Have a history of at least 30 pack-years of smoking and you currently smoke or have quit within the past 15 years. A pack-year is smoking an average of one pack of cigarettes per day for one year.  Yearly screening should:  Continue  until it has been 15 years since you quit.  Stop if you develop a health problem that would prevent you from having lung cancer treatment.  Colorectal Cancer  This type of cancer can be detected and can often be prevented.  Routine colorectal cancer screening usually begins at age 60 and continues through age 75.  If you have risk factors for colon cancer, your health care provider may recommend that you be screened at an earlier age.  If you have a family history of colorectal cancer, talk with your health care provider about genetic screening.  Your health care provider may also recommend using home test kits to check for hidden blood in your stool.  A small camera at the end of a tube can be used to examine your colon directly (sigmoidoscopy or colonoscopy). This is  done to check for the earliest forms of colorectal cancer.  Direct examination of the colon should be repeated every 5-10 years until age 42. However, if early forms of precancerous polyps or small growths are found or if you have a family history or genetic risk for colorectal cancer, you may need to be screened more often.  Skin Cancer  Check your skin from head to toe regularly.  Monitor any moles. Be sure to tell your health care provider: ? About any new moles or changes in moles, especially if there is a change in a mole's shape or color. ? If you have a mole that is larger than the size of a pencil eraser.  If any of your family members has a history of skin cancer, especially at a young age, talk with your health care provider about genetic screening.  Always use sunscreen. Apply sunscreen liberally and repeatedly throughout the day.  Whenever you are outside, protect yourself by wearing long sleeves, pants, a wide-brimmed hat, and sunglasses.  What should I know about osteoporosis? Osteoporosis is a condition in which bone destruction happens more quickly than new bone creation. After menopause, you may be  at an increased risk for osteoporosis. To help prevent osteoporosis or the bone fractures that can happen because of osteoporosis, the following is recommended:  If you are 55-72 years old, get at least 1,000 mg of calcium and at least 600 mg of vitamin D per day.  If you are older than age 42 but younger than age 51, get at least 1,200 mg of calcium and at least 600 mg of vitamin D per day.  If you are older than age 20, get at least 1,200 mg of calcium and at least 800 mg of vitamin D per day.  Smoking and excessive alcohol intake increase the risk of osteoporosis. Eat foods that are rich in calcium and vitamin D, and do weight-bearing exercises several times each week as directed by your health care provider. What should I know about how menopause affects my mental health? Depression may occur at any age, but it is more common as you become older. Common symptoms of depression include:  Low or sad mood.  Changes in sleep patterns.  Changes in appetite or eating patterns.  Feeling an overall lack of motivation or enjoyment of activities that you previously enjoyed.  Frequent crying spells.  Talk with your health care provider if you think that you are experiencing depression. What should I know about immunizations? It is important that you get and maintain your immunizations. These include:  Tetanus, diphtheria, and pertussis (Tdap) booster vaccine.  Influenza every year before the flu season begins.  Pneumonia vaccine.  Shingles vaccine.  Your health care provider may also recommend other immunizations. This information is not intended to replace advice given to you by your health care provider. Make sure you discuss any questions you have with your health care provider. Document Released: 11/20/2005 Document Revised: 04/17/2016 Document Reviewed: 07/02/2015 Elsevier Interactive Patient Education  2018 [employer].

## 2017-06-28 NOTE — Progress Notes (Signed)
Elizabeth Rollins [birth year] [id number]    History:    Presents for breast and pelvic exam with complaint of vaginal soreness. Normal Pap and mammogram history. TAH with BSO for fibroids on no HRT. Negative colonoscopy 2014. DEXA primary care, reports as normal. Current on vaccines. Recent bilateral cataract surgery  Past medical history, past surgical history, family history and social history were all reviewed and documented in the EPIC chart. 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  ROS:  A ROS was performed and pertinent positives and negatives are included.  Exam:  Vitals:   06/28/17 0959  BP: 116/72  Weight: 138 lb (62.6 kg)  Height: 5' 6.5" (1.689 m)   Body mass index is 21.94 kg/m.   General appearance:  Normal Thyroid:  Symmetrical, normal in size, without palpable masses or nodularity. Respiratory  Auscultation:  Clear without wheezing or rhonchi Cardiovascular  Auscultation:  Regular rate, without rubs, murmurs or gallops  Edema/varicosities:  Not grossly evident Abdominal  Soft,nontender, without masses, guarding or rebound.  Liver/spleen:  No organomegaly noted  Hernia:  None appreciated  Skin  Inspection:  Grossly normal   Breasts: Examined lying and sitting.     Right: Without masses, retractions, discharge or axillary adenopathy.     Left: Without masses, retractions, discharge or axillary adenopathy. Gentitourinary   Inguinal/mons:  Normal without inguinal adenopathy  External genitalia:  Normal  BUS/Urethra/Skene's glands:  Normal  Vagina:  Mild atrophy, wet prep positive for clues, TNTC bacteria  Cervix:  Uterus absent  Adnexa/parametria:     Rt: Without masses or tenderness.   Lt: Without masses or tenderness.  Anus and perineum: Normal  Digital rectal exam: Normal sphincter tone without palpated masses or tenderness  Assessment/Plan:  66 y.o. MBF G1 P1 for breast and pelvic exam.  TAH with BSO for fibroids on no HRT Bacterial vaginosis Primary  care-labs  Plan: Flagyl 500 twice daily for 7 days, alcohol precautions reviewed. Instructed to call if no relief. Refill of Diflucan and Terazol for when necessary use given per request. SBE's, continue annual screening mammogram, calcium rich diet, vitamin D 2000 daily encouraged. Home safety, fall prevention and importance of continuing weightbearing exercise reviewed.   [place], 11:09 AM 06/28/2017

## 2017-08-02 DIAGNOSIS — H43811 Vitreous degeneration, right eye: Secondary | ICD-10-CM | POA: Diagnosis not present

## 2017-08-02 DIAGNOSIS — H35352 Cystoid macular degeneration, left eye: Secondary | ICD-10-CM | POA: Diagnosis not present

## 2017-08-02 DIAGNOSIS — H35351 Cystoid macular degeneration, right eye: Secondary | ICD-10-CM | POA: Diagnosis not present

## 2017-08-02 DIAGNOSIS — H43812 Vitreous degeneration, left eye: Secondary | ICD-10-CM | POA: Diagnosis not present

## 2017-08-03 ENCOUNTER — Ambulatory Visit: Payer: Medicare Other | Admitting: Internal Medicine

## 2017-08-04 NOTE — Progress Notes (Signed)
Chief Complaint  Patient presents with  . Follow-up    A1c check after cataract surgery. Pt has been seen by Diabetic Retina Specialist, Dr Deloria Lair    HPI: Elizabeth Rollins 66 y.o. come in for a1c concern  Since having problem after cataract surgery .    Last seen 4 18      Cataracts surgery and had post op  infalmmation  And floaters  That has persisted  And given  different drops   Still has swelling   And had concern could there be an underling modifiable contribute  Since  Diabetic  Saw   Retina center  Dr Zadie Rhine.  Did retinal angiography  But not all the dye went through     Now on drops     For vision change.  Concerned cause vision is decrease and distorted  20/60?   ROS: See pertinent positives and negatives per HPI. No trauma cp sob   Past Medical History:  Diagnosis Date  . Enlarged uterus 2-02   TAH & BSO  . Miscarriage   . Varicella    hx as a child    Family History  Problem Relation Age of Onset  . Pulmonary embolism Mother        died 38  . Hypertension Mother   . Heart attack Father        died 56  . Diabetes Father   . Heart disease Father   . Allergies Sister   . Diabetes Brother   . Diabetes Brother   . Diabetes Sister   . Other Sister        Congestive Heart Failure  . Diabetes Sister   . Breast cancer Maternal Aunt   . Colon cancer Neg Hx     Social History   Social History  . Marital status: Married    Spouse name: N/A  . Number of children: N/A  . Years of education: N/A   Social History Main Topics  . Smoking status: Former Smoker    Quit date: 10/12/1982  . Smokeless tobacco: Never Used     Comment: smoked many years ago and can';t qunatify   . Alcohol use No  . Drug use: No  . Sexual activity: Yes    Birth control/ protection: Surgical     Comment: declined insurance questions   Other Topics Concern  . 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 CALCIUM PO Take by mouth.    . Cholecalciferol (VITAMIN D) 2000 UNITS tablet Take 2,000 Units by mouth daily.    . fish oil-omega-3 fatty acids 1000 MG capsule Take 2 g by mouth daily.      [name] MAGNESIUM PO Take 500 mg by mouth 2 (two) times daily.    . metroNIDAZOLE (FLAGYL) 500 MG tablet Take 1 tablet (500 mg total) by mouth 2 (two) times daily. (Patient not taking: Reported on 08/05/2017) 14 tablet 0  . terconazole (TERAZOL 7) 0.4 % vaginal cream Place 1 applicator vaginally at bedtime. (Patient not taking: Reported on 08/05/2017) 45 g 1   No facility-administered medications prior to visit.      EXAM:  BP 122/70 (BP Location: Right Arm, Patient Position: Sitting, Cuff Size: Normal)   Pulse 70   Temp 97.6 F (36.4 C) (Oral)   Wt 139 lb 12.8 oz (63.4 kg)   BMI 22.23 kg/m   Body mass index is 22.23 kg/m.  GENERAL: vitals reviewed and listed above, alert, oriented, appears well hydrated and in no acute distress PSYCH: pleasant and cooperative, no obvious depression or anxiety  BP Readings from Last 3 Encounters:  08/05/17 122/70  06/28/17 116/72  01/27/17 118/70    ASSESSMENT AND PLAN:  Discussed the following assessment and plan:  Eye inflammation - Plan: CBC with Differential/Platelet, Hemoglobin A1c, Sedimentation rate, C-reactive protein, Comprehensive metabolic panel  Fasting hyperglycemia - Plan: CBC with Differential/Platelet, Hemoglobin A1c, Sedimentation rate, C-reactive protein, Comprehensive metabolic panel  Family history of diabetes mellitus - Plan: CBC with Differential/Platelet, Hemoglobin A1c, Sedimentation rate, C-reactive protein, Comprehensive metabolic panel  Medication management  Wears glasses  Cystoid macular edema, unspecified laterality - Plan: CBC with Differential/Platelet,  Hemoglobin A1c, Sedimentation rate, C-reactive protein, Comprehensive metabolic panel No hx of underlying cause although risk of dm  And no autoimmune dis and eye disease in family.    Check labs today  And  She should ask if any other  Investigation by [place] would be helpful.  She has fu appt in about 1-2 weeks with dr Albin Fischer al.   Total visit 38mins > 50% spent counseling and coordinating care as indicated in above note and in instructions to patient .   -Patient advised to return or notify health care team  if  new concerns arise.  Patient Instructions  Will let you know results when back .    Ask dr Zadie Rhine if there are other  Causes  That we should be investigating To your eye condition.   I don't think it is a diabetes causing this .     Standley Brooking. Panosh M.D.

## 2017-08-05 ENCOUNTER — Ambulatory Visit (INDEPENDENT_AMBULATORY_CARE_PROVIDER_SITE_OTHER): Payer: Medicare Other | Admitting: Internal Medicine

## 2017-08-05 ENCOUNTER — Encounter: Payer: Self-pay | Admitting: Internal Medicine

## 2017-08-05 VITALS — BP 122/70 | HR 70 | Temp 97.6°F | Wt 139.8 lb

## 2017-08-05 DIAGNOSIS — R7301 Impaired fasting glucose: Secondary | ICD-10-CM

## 2017-08-05 DIAGNOSIS — H35359 Cystoid macular degeneration, unspecified eye: Secondary | ICD-10-CM | POA: Diagnosis not present

## 2017-08-05 DIAGNOSIS — H5789 Other specified disorders of eye and adnexa: Secondary | ICD-10-CM

## 2017-08-05 DIAGNOSIS — Z833 Family history of diabetes mellitus: Secondary | ICD-10-CM | POA: Diagnosis not present

## 2017-08-05 DIAGNOSIS — Z79899 Other long term (current) drug therapy: Secondary | ICD-10-CM

## 2017-08-05 DIAGNOSIS — Z973 Presence of spectacles and contact lenses: Secondary | ICD-10-CM

## 2017-08-05 LAB — COMPREHENSIVE METABOLIC PANEL
ALT: 12 U/L (ref 0–35)
AST: 17 U/L (ref 0–37)
Albumin: 4.3 g/dL (ref 3.5–5.2)
Alkaline Phosphatase: 58 U/L (ref 39–117)
BILIRUBIN TOTAL: 1 mg/dL (ref 0.2–1.2)
BUN: 15 mg/dL (ref 6–23)
CO2: 32 mEq/L (ref 19–32)
CREATININE: 0.68 mg/dL (ref 0.40–1.20)
Calcium: 9.5 mg/dL (ref 8.4–10.5)
Chloride: 100 mEq/L (ref 96–112)
GFR: 111.11 mL/min (ref >60.00)
Glucose, Bld: 92 mg/dL (ref 70–99)
Potassium: 3.9 mEq/L (ref 3.5–5.1)
SODIUM: 138 meq/L (ref 135–145)
TOTAL PROTEIN: 6.8 g/dL (ref 6.0–8.3)

## 2017-08-05 LAB — CBC WITH DIFFERENTIAL/PLATELET
BASOS ABS: 0 10*3/uL (ref 0.0–0.1)
Basophils Relative: 0.5 % (ref 0.0–3.0)
EOS ABS: 0.1 10*3/uL (ref 0.0–0.7)
Eosinophils Relative: 1 % (ref 0.0–5.0)
HCT: 43.2 % (ref 36.0–46.0)
Hemoglobin: 14.2 g/dL (ref 12.0–15.0)
LYMPHS ABS: 2 10*3/uL (ref 0.7–4.0)
LYMPHS PCT: 37.3 % (ref 12.0–46.0)
MCHC: 32.8 g/dL (ref 30.0–36.0)
MCV: 84.4 fl (ref 78.0–100.0)
MONOS PCT: 6.5 % (ref 3.0–12.0)
Monocytes Absolute: 0.3 10*3/uL (ref 0.1–1.0)
NEUTROS PCT: 54.7 % (ref 43.0–77.0)
Neutro Abs: 2.9 10*3/uL (ref 1.4–7.7)
PLATELETS: 177 10*3/uL (ref 150.0–400.0)
RBC: 5.12 Mil/uL — AB (ref 3.87–5.11)
RDW: 14.6 % (ref 11.5–15.5)
WBC: 5.2 10*3/uL (ref 4.0–10.5)

## 2017-08-05 LAB — C-REACTIVE PROTEIN: CRP: 0.2 mg/dL — AB (ref 0.5–20.0)

## 2017-08-05 LAB — SEDIMENTATION RATE: Sed Rate: 4 mm/hr (ref 0–30)

## 2017-08-05 LAB — HEMOGLOBIN A1C: Hgb A1c MFr Bld: 5.9 % (ref 4.6–6.5)

## 2017-08-05 NOTE — Patient Instructions (Addendum)
Will let you know results when back .    Ask dr Zadie Rhine if there are other  Causes  That we should be investigating To your eye condition.   I don't think it is a diabetes causing this .

## 2017-08-16 DIAGNOSIS — H43812 Vitreous degeneration, left eye: Secondary | ICD-10-CM | POA: Diagnosis not present

## 2017-08-16 DIAGNOSIS — H35351 Cystoid macular degeneration, right eye: Secondary | ICD-10-CM | POA: Diagnosis not present

## 2017-08-16 DIAGNOSIS — H43811 Vitreous degeneration, right eye: Secondary | ICD-10-CM | POA: Diagnosis not present

## 2017-08-16 DIAGNOSIS — Z961 Presence of intraocular lens: Secondary | ICD-10-CM | POA: Diagnosis not present

## 2017-08-16 DIAGNOSIS — H35352 Cystoid macular degeneration, left eye: Secondary | ICD-10-CM | POA: Diagnosis not present

## 2017-09-27 DIAGNOSIS — H43812 Vitreous degeneration, left eye: Secondary | ICD-10-CM | POA: Diagnosis not present

## 2017-09-27 DIAGNOSIS — H35351 Cystoid macular degeneration, right eye: Secondary | ICD-10-CM | POA: Diagnosis not present

## 2017-09-27 DIAGNOSIS — H35352 Cystoid macular degeneration, left eye: Secondary | ICD-10-CM | POA: Diagnosis not present

## 2017-09-27 DIAGNOSIS — H43811 Vitreous degeneration, right eye: Secondary | ICD-10-CM | POA: Diagnosis not present

## 2017-11-25 DIAGNOSIS — H35351 Cystoid macular degeneration, right eye: Secondary | ICD-10-CM | POA: Diagnosis not present

## 2017-11-25 DIAGNOSIS — H43811 Vitreous degeneration, right eye: Secondary | ICD-10-CM | POA: Diagnosis not present

## 2017-11-25 DIAGNOSIS — H43812 Vitreous degeneration, left eye: Secondary | ICD-10-CM | POA: Diagnosis not present

## 2017-11-25 DIAGNOSIS — H35352 Cystoid macular degeneration, left eye: Secondary | ICD-10-CM | POA: Diagnosis not present

## 2017-12-28 NOTE — Progress Notes (Signed)
Chief Complaint  Patient presents with  . Headache    Pt feels she had a migraine almost 2 weeks ago. On 3/9, she had a headache that last about 48hours through 3/10. Pt states that this HA was more dabilitating that most of her HAs. She experienced noise sensetivity, severe pain and Nausea. Denies any vomiting and visual disturbances. Pt states that she was able to rest and that seemed to help the migraine go away, did not take any meds.     HPI: Elizabeth Rollins 67 y.o. come in for  New problem   Concern for very severe had she has never had   Before   Went on for hours  And  2 days  And  Sunday  Again  Hard to get home   Severe phonophobia.  layed down 2- 7 and came back  After up and around .  2 days.      No meds.  ? Still not a good sleeper.  Has used  Allergy meds in past but not recently  Still feels has mild ha on side likt going to happen again .   Hx of has  One sided  In past .  Behind eye or other.  But has never had this type of ha   Had eye surgery and seeing better   [age] and then  macualr swelling.  injections .    r eye rx but better.  On check last month   When called they didn't think related  To her eye predicament and no change in vision since then  Vision improved from 20 50 to 20 20  Vit d and magnesium .  No tob  etoh ocass tea.   No visual changes.  Hx of bneadry  Sinus .  ROS: See pertinent positives and negatives per HPI. No current falling  New neuro sx  Neck is always problematic eating healthy  May not be hydrated lots of avocado nuts   And acitibty  Cautious with meds   To avoid kidney se  But take vit d  And mag at night that helps her sleep  Past Medical History:  Diagnosis Date  . Enlarged uterus 2-02   TAH & BSO  . Miscarriage   . Varicella    hx as a child    Family History  Problem Relation Age of Onset  . Pulmonary embolism Mother        died 70  . Hypertension Mother   . Heart attack Father        died 59  . Diabetes Father   . Heart  disease Father   . Allergies Sister   . Diabetes Brother   . Diabetes Brother   . Diabetes Sister   . Other Sister        Congestive Heart Failure  . Diabetes Sister   . Breast cancer Maternal Aunt   . Colon cancer Neg Hx     Social History   Socioeconomic History  . Marital status: Married    Spouse name: None  . Number of children: None  . Years of education: None  . Highest education level: None  Social Needs  . Financial resource strain: None  . Food insecurity - worry: None  . Food insecurity - inability: None  . Transportation needs - medical: None  . Transportation needs - non-medical: None  Occupational History  . None  Tobacco Use  . Smoking status: Former Smoker  Last attempt to quit: 10/12/1982    Years since quitting: 35.2  . Smokeless tobacco: Never Used  . Tobacco comment: smoked many years ago and can';t qunatify   Substance and Sexual Activity  . Alcohol use: No    Alcohol/week: 0.0 oz  . Drug use: No  . Sexual activity: Yes    Birth control/protection: Surgical    Comment: declined insurance questions  Other Topics Concern  . 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 CALCIUM PO Take by mouth.    . Cholecalciferol (VITAMIN D) 2000 UNITS tablet Take 2,000 Units by mouth daily.    . [employer] ARTIFICIAL TEARS 1-0.3 % SOLN Place 1 drop into both eyes 4 (four) times daily.  6  . fish oil-omega-3 fatty acids 1000 MG capsule Take 2 g by mouth daily.      [name] MAGNESIUM PO Take 500 mg by mouth 2 (two) times daily.    [name] PROLENSA 0.07 % SOLN Place 1 drop into the right eye daily.  6  . ketorolac (ACULAR) 0.5 % ophthalmic solution Place 1 drop into both eyes 4 (four) times daily.  6   No facility-administered medications prior  to visit.      EXAM:  BP 108/72 (BP Location: Right Arm, Patient Position: Sitting, Cuff Size: Normal)   Pulse 79   Temp 97.7 F (36.5 C) (Oral)   Wt 141 lb 3.2 oz (64 kg)   BMI 22.45 kg/m   Body mass index is 22.45 kg/m.  GENERAL: vitals reviewed and listed above, alert, oriented, appears well hydrated and in no acute distress HEENT: atraumatic, conjunctiva  clear, no obvious abnormalities on inspection of external nose and ears OP : no lesion edema or exudate  NECK: no obvious masses on inspection palpation  LUNGS: clear to auscultation bilaterally, no wheezes, rales or rhonchi, good air movement CV: HRRR, no clubbing cyanosis or  peripheral edema nl cap refill  MS: moves all extremities without noticeable focal  abnormality PSYCH: pleasant and cooperative, no obvious depression or anxiety Lab Results  Component Value Date   WBC 5.2 08/05/2017   HGB 14.2 08/05/2017   HCT 43.2 08/05/2017   PLT 177.0 08/05/2017   GLUCOSE 92 08/05/2017   CHOL 167 01/18/2017   TRIG 64 01/18/2017   HDL 80 (A) 01/18/2017   LDLCALC 74 01/18/2017   ALT 12 08/05/2017   AST 17 08/05/2017   NA 138 08/05/2017   K 3.9 08/05/2017   CL 100 08/05/2017   CREATININE 0.68 08/05/2017   BUN 15 08/05/2017   CO2 32 08/05/2017   TSH 1.63 01/27/2016   HGBA1C 5.9 08/05/2017   BP Readings from Last 3 Encounters:  12/29/17 108/72  08/05/17 122/70  06/28/17 116/72    ASSESSMENT AND PLAN:  Discussed the following assessment and plan:  New onset of headaches after age 52 - Plan: MR Brain Wo Contrast, CBC with Differential/Platelet, Sedimentation rate, C-reactive protein  Cystoid macular edema of both eyes - under rxc improved  Because of age and  Severity of ha and  Underlying vision predicament would proceed with scan  Mri  Can call for sedation if needed.  Inflammatory markers because of some  temporal location but  Not typical of TA  Etc  This could be a migraine albeit   Age  compels for  Close  observation and work up  -Patient advised to return or notify health care team  if  new concerns arise. Total visit 65mins > 50% spent counseling and coordinating care as indicated in above note and in instructions to patient .    Patient Instructions  Sounds like migraine that is more severe   . Than usual.   But not sure what triggered it  Often sleep change dehydration certain foods .     If comes again  Try 2 aleve  And if bad go to ed.    Can order scan  As we discussed.  MRI     Track your headaches  .     Recurrent Migraine Headache Migraines are a type of headache, and they are usually stronger and more sudden than normal headaches (tension headaches). Migraines are characterized by an intense pulsing, throbbing pain that is usually only present on one side of the head. Sometimes, migraine headaches can cause nausea, vomiting, sensitivity to light and sound, and vision changes. Recurrent migraines keep coming back (recurring). A migraine can last from 4 hours up to 3 days. What are the causes? The exact cause of this condition is not known. However, a migraine may be caused when nerves in the brain become irritated and release chemicals that cause inflammation of blood vessels. This inflammation causes pain. Certain things may also trigger migraines, such as:  A disruption in your regular eating and sleeping schedule.  Smoking.  Stress.  Menstruation.  Certain foods and drinks, such as: ? Aged cheese. ? Chocolate. ? Alcohol. ? Caffeine. ? Foods or drinks that contain nitrates, glutamate, aspartame, MSG, or tyramine.  Lack of sleep.  Hunger.  Physical exertion.  Fatigue.  High altitude.  Weather changes.  Medicines, such as: ? Nitroglycerin, which is used to treat chest pain. ? Birth control pills. ? Estrogen. ? Some blood pressure medicines.  What are the signs or symptoms? Symptoms of this condition vary for each person and may include:  Pain that  is usually only present on one side of the head. In some cases, the pain may be on both sides of the head or around the head or neck.  Pulsating or throbbing pain.  Severe pain that prevents daily activities.  Pain that is aggravated by any physical activity.  Nausea, vomiting, or both.  Dizziness.  Pain with exposure to bright lights, loud noises, or activity.  General sensitivity to bright lights, loud noises, or smells.  Before you get a migraine, you may get warning signs that a migraine is coming (aura). An aura may include:  Seeing flashing lights.  Seeing bright spots, halos, or zigzag lines.  Having tunnel vision or blurred vision.  Having numbness or a tingling feeling.  Having trouble talking.  Having muscle weakness.  Smelling a certain odor.  How is this diagnosed? This condition is often diagnosed based on:  Your symptoms and medical history.  A physical exam.  You may also have tests, including:  A CT scan or MRI of your brain. These imaging tests cannot diagnose migraines, but they can help to rule out other causes of headaches.  Blood tests.  How is this treated? This condition is treated with:  Medicines. These are used for: ? Lessening pain and nausea. ? Preventing recurrent migraines.  Lifestyle changes, such as changes to  your diet or sleeping patterns.  Behavior therapy, such as relaxation training or biofeedback. Biofeedback is a treatment that involves teaching you to relax and use your brain to lower your heart rate and control your breathing.  Follow these instructions at home: Medicines  Take over-the-counter and prescription medicines only as told by your health care provider.  Do not drive or use heavy machinery while taking prescription pain medicine. Lifestyle  Do not use any products that contain nicotine or tobacco, such as cigarettes and e-cigarettes. If you need help quitting, ask your health care provider.  Limit  alcohol intake to no more than 1 drink a day for nonpregnant women and 2 drinks a day for men. One drink equals 12 oz of beer, 5 oz of wine, or 1 oz of hard liquor.  Get 7-9 hours of sleep each night, or the amount of sleep recommended by your health care provider.  Limit your stress. Talk with your health care provider if you need help with stress management.  Maintain a healthy weight. If you need help losing weight, ask your health care provider.  Exercise regularly. Aim for 150 minutes of moderate-intensity exercise (walking, biking, yoga) or 75 minutes of vigorous exercise (running, circuit training, swimming) each week. General instructions  Keep a journal to find out what triggers your migraine headaches so you can avoid these triggers. For example, write down: ? What you eat and drink. ? How much sleep you get. ? Any change to your diet or medicines.  Lie down in a dark, quiet room when you have a migraine.  Try placing a cool towel over your head when you have a migraine.  Keep lights dim, if bright lights bother you and make your migraines worse.  Keep all follow-up visits as told by your health care provider. This is important. Contact a health care provider if:  Your pain does not improve, even with medicine.  Your migraines continue to return, even with medicine.  You have a fever.  You have weight loss. Get help right away if:  Your migraine becomes severe and medicine does not help.  You have a stiff neck.  You have a loss of vision.  You have muscle weakness or loss of muscle control.  You start losing your balance or have trouble walking.  You feel faint or you pass out.  You develop new, severe symptoms.  You start having abrupt severe headaches that last for a second or less, like a thunderclap. Summary  Migraine headaches are usually stronger and more sudden than normal headaches (tension headaches). Migraines are characterized by an intense  pulsing, throbbing pain that is usually only present on one side of the head.  The exact cause of this condition is not known. However, a migraine may be caused when nerves in the brain become irritated and release chemicals that cause inflammation of blood vessels.  Certain things may trigger migraines, such as changes to diet or sleeping patterns, smoking, certain foods, alcohol, stress, and certain medicines.  Sometimes, migraine headaches can cause nausea, vomiting, sensitivity to light and sound, and vision changes.  Migraines are often diagnosed based on your symptoms, medical history, and a physical exam. This information is not intended to replace advice given to you by your health care provider. Make sure you discuss any questions you have with your health care provider. Document Released: 06/23/2001 Document Revised: 07/10/2016 Document Reviewed: 07/10/2016 Elsevier Interactive Patient Education  [employer].  Standley Brooking. Panosh M.D.

## 2017-12-29 ENCOUNTER — Ambulatory Visit (INDEPENDENT_AMBULATORY_CARE_PROVIDER_SITE_OTHER): Payer: Medicare Other | Admitting: Internal Medicine

## 2017-12-29 ENCOUNTER — Encounter: Payer: Self-pay | Admitting: Internal Medicine

## 2017-12-29 VITALS — BP 108/72 | HR 79 | Temp 97.7°F | Wt 141.2 lb

## 2017-12-29 DIAGNOSIS — R51 Headache: Secondary | ICD-10-CM | POA: Diagnosis not present

## 2017-12-29 DIAGNOSIS — H35353 Cystoid macular degeneration, bilateral: Secondary | ICD-10-CM | POA: Diagnosis not present

## 2017-12-29 DIAGNOSIS — R519 Headache, unspecified: Secondary | ICD-10-CM

## 2017-12-29 LAB — CBC WITH DIFFERENTIAL/PLATELET
Basophils Absolute: 0 10*3/uL (ref 0.0–0.1)
Basophils Relative: 0.5 % (ref 0.0–3.0)
Eosinophils Absolute: 0.1 10*3/uL (ref 0.0–0.7)
Eosinophils Relative: 1.2 % (ref 0.0–5.0)
HCT: 42.8 % (ref 36.0–46.0)
Hemoglobin: 14.7 g/dL (ref 12.0–15.0)
LYMPHS ABS: 1.7 10*3/uL (ref 0.7–4.0)
Lymphocytes Relative: 28.7 % (ref 12.0–46.0)
MCHC: 34.2 g/dL (ref 30.0–36.0)
MCV: 84.2 fl (ref 78.0–100.0)
MONOS PCT: 6 % (ref 3.0–12.0)
Monocytes Absolute: 0.4 10*3/uL (ref 0.1–1.0)
NEUTROS ABS: 3.8 10*3/uL (ref 1.4–7.7)
NEUTROS PCT: 63.6 % (ref 43.0–77.0)
Platelets: 187 10*3/uL (ref 150.0–400.0)
RBC: 5.08 Mil/uL (ref 3.87–5.11)
RDW: 14.1 % (ref 11.5–15.5)
WBC: 5.9 10*3/uL (ref 4.0–10.5)

## 2017-12-29 LAB — SEDIMENTATION RATE: Sed Rate: 1 mm/hr (ref 0–30)

## 2017-12-29 NOTE — Patient Instructions (Addendum)
Sounds like migraine that is more severe   . Than usual.   But not sure what triggered it  Often sleep change dehydration certain foods .     If comes again  Try 2 aleve  And if bad go to ed.    Can order scan  As we discussed.  MRI     Track your headaches  .     Recurrent Migraine Headache Migraines are a type of headache, and they are usually stronger and more sudden than normal headaches (tension headaches). Migraines are characterized by an intense pulsing, throbbing pain that is usually only present on one side of the head. Sometimes, migraine headaches can cause nausea, vomiting, sensitivity to light and sound, and vision changes. Recurrent migraines keep coming back (recurring). A migraine can last from 4 hours up to 3 days. What are the causes? The exact cause of this condition is not known. However, a migraine may be caused when nerves in the brain become irritated and release chemicals that cause inflammation of blood vessels. This inflammation causes pain. Certain things may also trigger migraines, such as:  A disruption in your regular eating and sleeping schedule.  Smoking.  Stress.  Menstruation.  Certain foods and drinks, such as: ? Aged cheese. ? Chocolate. ? Alcohol. ? Caffeine. ? Foods or drinks that contain nitrates, glutamate, aspartame, MSG, or tyramine.  Lack of sleep.  Hunger.  Physical exertion.  Fatigue.  High altitude.  Weather changes.  Medicines, such as: ? Nitroglycerin, which is used to treat chest pain. ? Birth control pills. ? Estrogen. ? Some blood pressure medicines.  What are the signs or symptoms? Symptoms of this condition vary for each person and may include:  Pain that is usually only present on one side of the head. In some cases, the pain may be on both sides of the head or around the head or neck.  Pulsating or throbbing pain.  Severe pain that prevents daily activities.  Pain that is aggravated by any physical  activity.  Nausea, vomiting, or both.  Dizziness.  Pain with exposure to bright lights, loud noises, or activity.  General sensitivity to bright lights, loud noises, or smells.  Before you get a migraine, you may get warning signs that a migraine is coming (aura). An aura may include:  Seeing flashing lights.  Seeing bright spots, halos, or zigzag lines.  Having tunnel vision or blurred vision.  Having numbness or a tingling feeling.  Having trouble talking.  Having muscle weakness.  Smelling a certain odor.  How is this diagnosed? This condition is often diagnosed based on:  Your symptoms and medical history.  A physical exam.  You may also have tests, including:  A CT scan or MRI of your brain. These imaging tests cannot diagnose migraines, but they can help to rule out other causes of headaches.  Blood tests.  How is this treated? This condition is treated with:  Medicines. These are used for: ? Lessening pain and nausea. ? Preventing recurrent migraines.  Lifestyle changes, such as changes to your diet or sleeping patterns.  Behavior therapy, such as relaxation training or biofeedback. Biofeedback is a treatment that involves teaching you to relax and use your brain to lower your heart rate and control your breathing.  Follow these instructions at home: Medicines  Take over-the-counter and prescription medicines only as told by your health care provider.  Do not drive or use heavy machinery while taking prescription pain medicine. Lifestyle  Do not use any products that contain nicotine or tobacco, such as cigarettes and e-cigarettes. If you need help quitting, ask your health care provider.  Limit alcohol intake to no more than 1 drink a day for nonpregnant women and 2 drinks a day for men. One drink equals 12 oz of beer, 5 oz of wine, or 1 oz of hard liquor.  Get 7-9 hours of sleep each night, or the amount of sleep recommended by your health care  provider.  Limit your stress. Talk with your health care provider if you need help with stress management.  Maintain a healthy weight. If you need help losing weight, ask your health care provider.  Exercise regularly. Aim for 150 minutes of moderate-intensity exercise (walking, biking, yoga) or 75 minutes of vigorous exercise (running, circuit training, swimming) each week. General instructions  Keep a journal to find out what triggers your migraine headaches so you can avoid these triggers. For example, write down: ? What you eat and drink. ? How much sleep you get. ? Any change to your diet or medicines.  Lie down in a dark, quiet room when you have a migraine.  Try placing a cool towel over your head when you have a migraine.  Keep lights dim, if bright lights bother you and make your migraines worse.  Keep all follow-up visits as told by your health care provider. This is important. Contact a health care provider if:  Your pain does not improve, even with medicine.  Your migraines continue to return, even with medicine.  You have a fever.  You have weight loss. Get help right away if:  Your migraine becomes severe and medicine does not help.  You have a stiff neck.  You have a loss of vision.  You have muscle weakness or loss of muscle control.  You start losing your balance or have trouble walking.  You feel faint or you pass out.  You develop new, severe symptoms.  You start having abrupt severe headaches that last for a second or less, like a thunderclap. Summary  Migraine headaches are usually stronger and more sudden than normal headaches (tension headaches). Migraines are characterized by an intense pulsing, throbbing pain that is usually only present on one side of the head.  The exact cause of this condition is not known. However, a migraine may be caused when nerves in the brain become irritated and release chemicals that cause inflammation of blood  vessels.  Certain things may trigger migraines, such as changes to diet or sleeping patterns, smoking, certain foods, alcohol, stress, and certain medicines.  Sometimes, migraine headaches can cause nausea, vomiting, sensitivity to light and sound, and vision changes.  Migraines are often diagnosed based on your symptoms, medical history, and a physical exam. This information is not intended to replace advice given to you by your health care provider. Make sure you discuss any questions you have with your health care provider. Document Released: 06/23/2001 Document Revised: 07/10/2016 Document Reviewed: 07/10/2016 Elsevier Interactive Patient Education  [employer].

## 2017-12-30 LAB — C-REACTIVE PROTEIN: CRP: 0.2 mg/dL — ABNORMAL LOW (ref 0.5–20.0)

## 2018-01-17 ENCOUNTER — Ambulatory Visit
Admission: RE | Admit: 2018-01-17 | Discharge: 2018-01-17 | Disposition: A | Discharge status: Home / Self Care | Payer: Medicare Other | Source: Ambulatory Visit | Attending: Internal Medicine | Admitting: Internal Medicine

## 2018-01-17 DIAGNOSIS — R519 Headache, unspecified: Secondary | ICD-10-CM

## 2018-01-17 DIAGNOSIS — R51 Headache: Principal | ICD-10-CM

## 2018-01-18 ENCOUNTER — Other Ambulatory Visit: Payer: Medicare Other

## 2018-02-22 DIAGNOSIS — H35352 Cystoid macular degeneration, left eye: Secondary | ICD-10-CM | POA: Diagnosis not present

## 2018-02-22 DIAGNOSIS — H43812 Vitreous degeneration, left eye: Secondary | ICD-10-CM | POA: Diagnosis not present

## 2018-02-22 DIAGNOSIS — H43811 Vitreous degeneration, right eye: Secondary | ICD-10-CM | POA: Diagnosis not present

## 2018-02-22 DIAGNOSIS — H35351 Cystoid macular degeneration, right eye: Secondary | ICD-10-CM | POA: Diagnosis not present

## 2018-04-27 ENCOUNTER — Other Ambulatory Visit: Payer: Self-pay | Admitting: Internal Medicine

## 2018-04-27 DIAGNOSIS — Z1231 Encounter for screening mammogram for malignant neoplasm of breast: Secondary | ICD-10-CM

## 2018-05-09 ENCOUNTER — Ambulatory Visit
Admission: RE | Admit: 2018-05-09 | Discharge: 2018-05-09 | Disposition: A | Discharge status: Home / Self Care | Payer: Medicare Other | Source: Ambulatory Visit | Attending: Internal Medicine | Admitting: Internal Medicine

## 2018-05-09 DIAGNOSIS — Z1231 Encounter for screening mammogram for malignant neoplasm of breast: Secondary | ICD-10-CM

## 2018-05-09 NOTE — Progress Notes (Signed)
Chief Complaint  Patient presents with  . Migraine    Pt states the most recent Migraine was Sunday 7/28 - seems to be feeling better today. Pt notes nausea with episodes. Pt denies vomiting and visual disturbances.     HPI: Elizabeth Rollins 67 y.o. come in for  Problem with headaches   Last one began behind  Right eye and  Then spreads all around.  Feels sick with headache  .  Can switch side of heads  Mom did have  headaches   Using magnesium and  2 aleve not  Enough on 2 and 3 has   But ok for mild headaches  Not specific triggers sleep never that good .   Eats a good bit of nuts and avocadoes but not specific trigger.   ROS: See pertinent positives and negatives per HPI. No cp sob fainting  Eye situation stable   Past Medical History:  Diagnosis Date  . Enlarged uterus 2-02   TAH & BSO  . Miscarriage   . Varicella    hx as a child    Family History  Problem Relation Age of Onset  . Pulmonary embolism Mother        died 51  . Hypertension Mother   . Heart attack Father        died 41  . Diabetes Father   . Heart disease Father   . Allergies Sister   . Diabetes Brother   . Diabetes Brother   . Diabetes Sister   . Other Sister        Congestive Heart Failure  . Diabetes Sister   . Breast cancer Maternal Aunt   . Colon cancer Neg Hx     Social History   Socioeconomic History  . Marital status: Married    Spouse name: Not on file  . Number of children: Not on file  . Years of education: Not on file  . Highest education level: Not on file  Occupational History  . Not on file  Social Needs  . Financial resource strain: Not on file  . Food insecurity:    Worry: Not on file    Inability: Not on file  . Transportation needs:    Medical: Not on file    Non-medical: Not on file  Tobacco Use  . Smoking status: Former Smoker    Last attempt to quit: 10/12/1982    Years since quitting: 35.6  . Smokeless tobacco: Never Used  . Tobacco comment: smoked many  years ago and can';t qunatify   Substance and Sexual Activity  . Alcohol use: No    Alcohol/week: 0.0 oz  . Drug use: No  . Sexual activity: Yes    Birth control/protection: Surgical    Comment: declined insurance questions  Lifestyle  . Physical activity:    Days per week: Not on file    Minutes per session: Not on file  . Stress: Not on file  Relationships  . Social connections:    Talks on phone: Not on file    Gets together: Not on file    Attends religious service: Not on file    Active member of club or organization: Not on file    Attends meetings of clubs or organizations: Not on file    Relationship status: Not on file  Other Topics Concern  . 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 CALCIUM PO Take by mouth.    . Cholecalciferol (VITAMIN D) 2000 UNITS tablet Take 2,000 Units by mouth daily.    . [employer] ARTIFICIAL TEARS 1-0.3 % SOLN Place 1 drop into both eyes 4 (four) times daily.  6  . fish oil-omega-3 fatty acids 1000 MG capsule Take 2 g by mouth daily.      [name] MAGNESIUM PO Take 500 mg by mouth 2 (two) times daily.    [name] PROLENSA 0.07 % SOLN Place 1 drop into the right eye daily.  6   No facility-administered medications prior to visit.      EXAM:  BP 120/80 (BP Location: Right Arm, Patient Position: Sitting, Cuff Size: Normal)   Pulse 68   Temp 98 F (36.7 C) (Oral)   Wt 138 lb 8 oz (62.8 kg)   BMI 22.02 kg/m   Body mass index is 22.02 kg/m.  GENERAL: vitals reviewed and listed above, alert, oriented, appears well hydrated and in no acute distress HEENT: atraumatic, conjunctiva  clear, no obvious abnormalities on inspection of external nose and ears PSYCH: pleasant and cooperative, no obvious depression or  anxiety Lab Results  Component Value Date   WBC 5.9 12/29/2017   HGB 14.7 12/29/2017   HCT 42.8 12/29/2017   PLT 187.0 12/29/2017   GLUCOSE 92 08/05/2017   CHOL 167 01/18/2017   TRIG 64 01/18/2017   HDL 80 (A) 01/18/2017   LDLCALC 74 01/18/2017   ALT 12 08/05/2017   AST 17 08/05/2017   NA 138 08/05/2017   K 3.9 08/05/2017   CL 100 08/05/2017   CREATININE 0.68 08/05/2017   BUN 15 08/05/2017   CO2 32 08/05/2017   TSH 1.63 01/27/2016   HGBA1C 5.9 08/05/2017   BP Readings from Last 3 Encounters:  05/10/18 120/80  12/29/17 108/72  08/05/17 122/70   reviewed calendar  ASSESSMENT AND PLAN:  Discussed the following assessment and plan:  Recurrent headache - see text  Acts like  typical migraine  with  Onset unilateral spreading  sick headache that resolved's without neuro sx and   Episodic  .  Less frequent but calendaring  Only one this month but very  severe about 3 days ago  Aleve not helpful if  A bad headache   options discussed and  Deferring on triptans at this time cause of potential se.  Albeit no vascular diseaese noted .   Trial  Naprosyn 500ng  and  zofran      Disc Excedrin migraine  Risk benefit if needed .    Continue calendaring  Yearly exam check in January   Or as needed .  -Patient advised to return or notify health care team  if  new concerns arise.  Patient Instructions  these seems like  Specific   Migraines .   Use  Cold packs for the headaches .  Add     Antinausea medicine   . Will send in antinauasea medication.   Can try   Excedrin migraine if needed  As a rescue has caffeine in them .    Recurrent Migraine Headache Migraines are a type of headache, and they are usually stronger and more sudden than normal headaches (tension headaches). Migraines are characterized by an intense pulsing, throbbing  pain that is usually only present on one side of the head. Sometimes, migraine headaches can cause nausea, vomiting, sensitivity to light and sound, and  vision changes. Recurrent migraines keep coming back (recurring). A migraine can last from 4 hours up to 3 days. What are the causes? The exact cause of this condition is not known. However, a migraine may be caused when nerves in the brain become irritated and release chemicals that cause inflammation of blood vessels. This inflammation causes pain. Certain things may also trigger migraines, such as:  A disruption in your regular eating and sleeping schedule.  Smoking.  Stress.  Menstruation.  Certain foods and drinks, such as: ? Aged cheese. ? Chocolate. ? Alcohol. ? Caffeine. ? Foods or drinks that contain nitrates, glutamate, aspartame, MSG, or tyramine.  Lack of sleep.  Hunger.  Physical exertion.  Fatigue.  High altitude.  Weather changes.  Medicines, such as: ? Nitroglycerin, which is used to treat chest pain. ? Birth control pills. ? Estrogen. ? Some blood pressure medicines.  What are the signs or symptoms? Symptoms of this condition vary for each person and may include:  Pain that is usually only present on one side of the head. In some cases, the pain may be on both sides of the head or around the head or neck.  Pulsating or throbbing pain.  Severe pain that prevents daily activities.  Pain that is aggravated by any physical activity.  Nausea, vomiting, or both.  Dizziness.  Pain with exposure to bright lights, loud noises, or activity.  General sensitivity to bright lights, loud noises, or smells.  Before you get a migraine, you may get warning signs that a migraine is coming (aura). An aura may include:  Seeing flashing lights.  Seeing bright spots, halos, or zigzag lines.  Having tunnel vision or blurred vision.  Having numbness or a tingling feeling.  Having trouble talking.  Having muscle weakness.  Smelling a certain odor.  How is this diagnosed? This condition is often diagnosed based on:  Your symptoms and medical  history.  A physical exam.  You may also have tests, including:  A CT scan or MRI of your brain. These imaging tests cannot diagnose migraines, but they can help to rule out other causes of headaches.  Blood tests.  How is this treated? This condition is treated with:  Medicines. These are used for: ? Lessening pain and nausea. ? Preventing recurrent migraines.  Lifestyle changes, such as changes to your diet or sleeping patterns.  Behavior therapy, such as relaxation training or biofeedback. Biofeedback is a treatment that involves teaching you to relax and use your brain to lower your heart rate and control your breathing.  Follow these instructions at home: Medicines  Take over-the-counter and prescription medicines only as told by your health care provider.  Do not drive or use heavy machinery while taking prescription pain medicine. Lifestyle  Do not use any products that contain nicotine or tobacco, such as cigarettes and e-cigarettes. If you need help quitting, ask your health care provider.  Limit alcohol intake to no more than 1 drink a day for nonpregnant women and 2 drinks a day for men. One drink equals 12 oz of beer, 5 oz of wine, or 1 oz of hard liquor.  Get 7-9 hours of sleep each night, or the amount of sleep recommended by your health care provider.  Limit your stress. Talk with your health care provider if you need help with stress management.  Maintain a healthy weight. If you need help losing weight, ask your health care provider.  Exercise regularly. Aim for 150 minutes of moderate-intensity exercise (walking, biking, yoga) or 75 minutes of vigorous exercise (running, circuit training, swimming) each week. General instructions  Keep a journal to find out what triggers your migraine headaches so you can avoid these triggers. For example, write down: ? What you eat and drink. ? How much sleep you get. ? Any change to your diet or medicines.  Lie down  in a dark, quiet room when you have a migraine.  Try placing a cool towel over your head when you have a migraine.  Keep lights dim, if bright lights bother you and make your migraines worse.  Keep all follow-up visits as told by your health care provider. This is important. Contact a health care provider if:  Your pain does not improve, even with medicine.  Your migraines continue to return, even with medicine.  You have a fever.  You have weight loss. Get help right away if:  Your migraine becomes severe and medicine does not help.  You have a stiff neck.  You have a loss of vision.  You have muscle weakness or loss of muscle control.  You start losing your balance or have trouble walking.  You feel faint or you pass out.  You develop new, severe symptoms.  You start having abrupt severe headaches that last for a second or less, like a thunderclap. Summary  Migraine headaches are usually stronger and more sudden than normal headaches (tension headaches). Migraines are characterized by an intense pulsing, throbbing pain that is usually only present on one side of the head.  The exact cause of this condition is not known. However, a migraine may be caused when nerves in the brain become irritated and release chemicals that cause inflammation of blood vessels.  Certain things may trigger migraines, such as changes to diet or sleeping patterns, smoking, certain foods, alcohol, stress, and certain medicines.  Sometimes, migraine headaches can cause nausea, vomiting, sensitivity to light and sound, and vision changes.  Migraines are often diagnosed based on your symptoms, medical history, and a physical exam. This information is not intended to replace advice given to you by your health care provider. Make sure you discuss any questions you have with your health care provider. Document Released: 06/23/2001 Document Revised: 07/10/2016 Document Reviewed: 07/10/2016 Elsevier  Interactive Patient Education  2018 [place]. Panosh M.D.

## 2018-05-10 ENCOUNTER — Encounter: Payer: Self-pay | Admitting: Internal Medicine

## 2018-05-10 ENCOUNTER — Ambulatory Visit (INDEPENDENT_AMBULATORY_CARE_PROVIDER_SITE_OTHER): Payer: Medicare Other | Admitting: Internal Medicine

## 2018-05-10 VITALS — BP 120/80 | HR 68 | Temp 98.0°F | Wt 138.5 lb

## 2018-05-10 DIAGNOSIS — R51 Headache: Secondary | ICD-10-CM | POA: Diagnosis not present

## 2018-05-10 DIAGNOSIS — R519 Headache, unspecified: Secondary | ICD-10-CM

## 2018-05-10 MED ORDER — NAPROXEN 500 MG PO TABS: ORAL_TABLET | ORAL | 0 refills | Status: DC

## 2018-05-10 MED ORDER — ONDANSETRON 4 MG PO TBDP: 4.0000 mg | ORAL_TABLET | Freq: Three times a day (TID) | ORAL | 0 refills | Status: DC | PRN

## 2018-05-10 NOTE — Patient Instructions (Addendum)
these seems like  Specific   Migraines .   Use  Cold packs for the headaches .  Add     Antinausea medicine   . Will send in antinauasea medication.   Can try   Excedrin migraine if needed  As a rescue has caffeine in them .    Recurrent Migraine Headache Migraines are a type of headache, and they are usually stronger and more sudden than normal headaches (tension headaches). Migraines are characterized by an intense pulsing, throbbing pain that is usually only present on one side of the head. Sometimes, migraine headaches can cause nausea, vomiting, sensitivity to light and sound, and vision changes. Recurrent migraines keep coming back (recurring). A migraine can last from 4 hours up to 3 days. What are the causes? The exact cause of this condition is not known. However, a migraine may be caused when nerves in the brain become irritated and release chemicals that cause inflammation of blood vessels. This inflammation causes pain. Certain things may also trigger migraines, such as:  A disruption in your regular eating and sleeping schedule.  Smoking.  Stress.  Menstruation.  Certain foods and drinks, such as: ? Aged cheese. ? Chocolate. ? Alcohol. ? Caffeine. ? Foods or drinks that contain nitrates, glutamate, aspartame, MSG, or tyramine.  Lack of sleep.  Hunger.  Physical exertion.  Fatigue.  High altitude.  Weather changes.  Medicines, such as: ? Nitroglycerin, which is used to treat chest pain. ? Birth control pills. ? Estrogen. ? Some blood pressure medicines.  What are the signs or symptoms? Symptoms of this condition vary for each person and may include:  Pain that is usually only present on one side of the head. In some cases, the pain may be on both sides of the head or around the head or neck.  Pulsating or throbbing pain.  Severe pain that prevents daily activities.  Pain that is aggravated by any physical activity.  Nausea, vomiting, or  both.  Dizziness.  Pain with exposure to bright lights, loud noises, or activity.  General sensitivity to bright lights, loud noises, or smells.  Before you get a migraine, you may get warning signs that a migraine is coming (aura). An aura may include:  Seeing flashing lights.  Seeing bright spots, halos, or zigzag lines.  Having tunnel vision or blurred vision.  Having numbness or a tingling feeling.  Having trouble talking.  Having muscle weakness.  Smelling a certain odor.  How is this diagnosed? This condition is often diagnosed based on:  Your symptoms and medical history.  A physical exam.  You may also have tests, including:  A CT scan or MRI of your brain. These imaging tests cannot diagnose migraines, but they can help to rule out other causes of headaches.  Blood tests.  How is this treated? This condition is treated with:  Medicines. These are used for: ? Lessening pain and nausea. ? Preventing recurrent migraines.  Lifestyle changes, such as changes to your diet or sleeping patterns.  Behavior therapy, such as relaxation training or biofeedback. Biofeedback is a treatment that involves teaching you to relax and use your brain to lower your heart rate and control your breathing.  Follow these instructions at home: Medicines  Take over-the-counter and prescription medicines only as told by your health care provider.  Do not drive or use heavy machinery while taking prescription pain medicine. Lifestyle  Do not use any products that contain nicotine or tobacco, such as cigarettes and  e-cigarettes. If you need help quitting, ask your health care provider.  Limit alcohol intake to no more than 1 drink a day for nonpregnant women and 2 drinks a day for men. One drink equals 12 oz of beer, 5 oz of wine, or 1 oz of hard liquor.  Get 7-9 hours of sleep each night, or the amount of sleep recommended by your health care provider.  Limit your stress.  Talk with your health care provider if you need help with stress management.  Maintain a healthy weight. If you need help losing weight, ask your health care provider.  Exercise regularly. Aim for 150 minutes of moderate-intensity exercise (walking, biking, yoga) or 75 minutes of vigorous exercise (running, circuit training, swimming) each week. General instructions  Keep a journal to find out what triggers your migraine headaches so you can avoid these triggers. For example, write down: ? What you eat and drink. ? How much sleep you get. ? Any change to your diet or medicines.  Lie down in a dark, quiet room when you have a migraine.  Try placing a cool towel over your head when you have a migraine.  Keep lights dim, if bright lights bother you and make your migraines worse.  Keep all follow-up visits as told by your health care provider. This is important. Contact a health care provider if:  Your pain does not improve, even with medicine.  Your migraines continue to return, even with medicine.  You have a fever.  You have weight loss. Get help right away if:  Your migraine becomes severe and medicine does not help.  You have a stiff neck.  You have a loss of vision.  You have muscle weakness or loss of muscle control.  You start losing your balance or have trouble walking.  You feel faint or you pass out.  You develop new, severe symptoms.  You start having abrupt severe headaches that last for a second or less, like a thunderclap. Summary  Migraine headaches are usually stronger and more sudden than normal headaches (tension headaches). Migraines are characterized by an intense pulsing, throbbing pain that is usually only present on one side of the head.  The exact cause of this condition is not known. However, a migraine may be caused when nerves in the brain become irritated and release chemicals that cause inflammation of blood vessels.  Certain things may  trigger migraines, such as changes to diet or sleeping patterns, smoking, certain foods, alcohol, stress, and certain medicines.  Sometimes, migraine headaches can cause nausea, vomiting, sensitivity to light and sound, and vision changes.  Migraines are often diagnosed based on your symptoms, medical history, and a physical exam. This information is not intended to replace advice given to you by your health care provider. Make sure you discuss any questions you have with your health care provider. Document Released: 06/23/2001 Document Revised: 07/10/2016 Document Reviewed: 07/10/2016 Elsevier Interactive Patient Education  [employer].

## 2018-10-18 DIAGNOSIS — Z0100 Encounter for examination of eyes and vision without abnormal findings: Secondary | ICD-10-CM | POA: Diagnosis not present

## 2018-10-18 DIAGNOSIS — Z961 Presence of intraocular lens: Secondary | ICD-10-CM | POA: Diagnosis not present

## 2018-11-08 ENCOUNTER — Ambulatory Visit: Payer: Medicare Other | Admitting: Internal Medicine

## 2018-12-02 ENCOUNTER — Other Ambulatory Visit: Payer: Self-pay | Admitting: Internal Medicine

## 2018-12-02 DIAGNOSIS — Z1231 Encounter for screening mammogram for malignant neoplasm of breast: Secondary | ICD-10-CM

## 2019-04-25 ENCOUNTER — Ambulatory Visit (INDEPENDENT_AMBULATORY_CARE_PROVIDER_SITE_OTHER): Payer: Medicare Other | Admitting: Internal Medicine

## 2019-04-25 ENCOUNTER — Encounter: Payer: Self-pay | Admitting: Internal Medicine

## 2019-04-25 VITALS — Temp 97.1°F | Ht 66.5 in | Wt 137.0 lb

## 2019-04-25 DIAGNOSIS — R51 Headache: Secondary | ICD-10-CM

## 2019-04-25 DIAGNOSIS — Z0289 Encounter for other administrative examinations: Secondary | ICD-10-CM

## 2019-04-25 DIAGNOSIS — R7301 Impaired fasting glucose: Secondary | ICD-10-CM

## 2019-04-25 DIAGNOSIS — Z833 Family history of diabetes mellitus: Secondary | ICD-10-CM

## 2019-04-25 DIAGNOSIS — E785 Hyperlipidemia, unspecified: Secondary | ICD-10-CM | POA: Diagnosis not present

## 2019-04-25 DIAGNOSIS — R519 Headache, unspecified: Secondary | ICD-10-CM

## 2019-04-25 DIAGNOSIS — H35353 Cystoid macular degeneration, bilateral: Secondary | ICD-10-CM | POA: Diagnosis not present

## 2019-04-25 NOTE — Progress Notes (Signed)
Virtual Visit via Video Note  I connected with@ on 04/25/19 at  3:00 PM EDT by a video enabled telemedicine application and verified that I am speaking with the correct person using two identifiers. Location patient: home Location provider:work  office Persons participating in the virtual visit: patient, provider  [place] national recommendations  regarding COVID 19 pandemic   video visit is advised over in office visit for this patient.  Patient aware  of the limitations of evaluation and management by telemedicine and  Limited availability of in person appointments. and agreed to proceed.   HPI: Elizabeth Rollins presents for video visit Last visit 7 19  Yearly  Visit  Since last   Has macular degeneration  Hx of headaches and borderline elebvaated BG      She said the headaches got better and no linger on meds for this. [facility] test not available yet  Gets joint feet pain and  and back pain not new  And no injury or other change  in health  [facility] of 2 husband no tad walks and tries to stay active  And avoiding sugars to  Control blood sugar .   ROS: See pertinent positives and negatives per HPI. No cp sob fall   Past Medical History:  Diagnosis Date  . Enlarged uterus 2-02   TAH & BSO  . Miscarriage   . Varicella    hx as a child    Past Surgical History:  Procedure Laterality Date  . ABDOMINAL HYSTERECTOMY  2-02   TAH and BSO for nonmalignant reasons bleeding  . BREAST CYST ASPIRATION Left   . BREAST SURGERY     Lumpectomy-Benign  . cataract surgery  06/2017   bilatreral  . LAMINECTOMY  2002   and repeat  . LUMBAR DISC SURGERY     x 2  . OOPHORECTOMY     BSO    Family History  Problem Relation Age of Onset  . Pulmonary embolism Mother        died 69  . Hypertension Mother   . Heart attack Father        died 71  . Diabetes Father   . Heart disease Father   . Allergies Sister   . Diabetes Brother   . Diabetes Brother   . Diabetes  Sister   . Other Sister        Congestive Heart Failure  . Diabetes Sister   . Breast cancer Maternal Aunt   . Colon cancer Neg Hx     Social History   Tobacco Use  . Smoking status: Former Smoker    Quit date: 10/12/1982    Years since quitting: 36.5  . Smokeless tobacco: Never Used  . Tobacco comment: smoked many years ago and can';t qunatify   Substance Use Topics  . Alcohol use: No    Alcohol/week: 0.0 standard drinks  . Drug use: No      Current Outpatient Medications:  .  CALCIUM PO, Take by mouth., Disp: , Rfl:  .  Cholecalciferol (VITAMIN D) 2000 UNITS tablet, Take 2,000 Units by mouth daily., Disp: , Rfl:  .  [employer] ARTIFICIAL TEARS 1-0.3 % SOLN, Place 1 drop into both eyes 4 (four) times daily., Disp: , Rfl: 6 .  fish oil-omega-3 fatty acids 1000 MG capsule, Take 2 g by mouth daily.  , Disp: , Rfl:  .  MAGNESIUM PO, Take 500 mg by mouth 2 (two) times daily., Disp: ,  Rfl:  .  naproxen (NAPROSYN) 500 MG tablet, Take 1 po at onset of headache  Can repeat in 8- 12 hours (Patient not taking: Reported on 04/25/2019), Disp: 24 tablet, Rfl: 0 .  ondansetron (ZOFRAN-ODT) 4 MG disintegrating tablet, Take 1 tablet (4 mg total) by mouth every 8 (eight) hours as needed for nausea or vomiting. (Patient not taking: Reported on 04/25/2019), Disp: 20 tablet, Rfl: 0  EXAM: BP Readings from Last 3 Encounters:  05/10/18 120/80  12/29/17 108/72  08/05/17 122/70    VITALS per patient if applicable:  GENERAL: alert, oriented, appears well and in no acute distress  HEENT: atraumatic, conjunttiva clear, no obvious abnormalities on inspection of external nose and ears NECK: normal movements of the head and neck LUNGS: on inspection no signs of respiratory distress, breathing rate appears normal, no obvious gross SOB, gasping or wheezing CV: no obvious cyanosis MS: moves all visible extremities without noticeable abnormality PSYCH/NEURO: pleasant and cooperative, no obvious depression or  anxiety, speech and thought processing grossly intact Lab Results  Component Value Date   WBC 5.9 12/29/2017   HGB 14.7 12/29/2017   HCT 42.8 12/29/2017   PLT 187.0 12/29/2017   GLUCOSE 92 08/05/2017   CHOL 167 01/18/2017   TRIG 64 01/18/2017   HDL 80 (A) 01/18/2017   LDLCALC 74 01/18/2017   ALT 12 08/05/2017   AST 17 08/05/2017   NA 138 08/05/2017   K 3.9 08/05/2017   CL 100 08/05/2017   CREATININE 0.68 08/05/2017   BUN 15 08/05/2017   CO2 32 08/05/2017   TSH 1.63 01/27/2016   HGBA1C 5.9 08/05/2017    ASSESSMENT AND PLAN:  Discussed the following assessment and plan:    ICD-10-CM   1. Fasting hyperglycemia  R73.01 Comprehensive metabolic panel    Lipid panel    Hemoglobin A1c  2. Family history of diabetes mellitus  Z83.3 Comprehensive metabolic panel    Lipid panel    Hemoglobin A1c  3. Hyperlipidemia, unspecified hyperlipidemia type  E78.5 Comprehensive metabolic panel    Lipid panel    Hemoglobin A1c  4. Recurrent headache in remission  R51   5. Cystoid macular edema of both eyes  H35.353     Seems to be doing well and headaches are not problematic at this time   Due for blood sugar monitoring   Continue lifestyle intervention healthy eating and exercise . reviewed immuniz and due for pneumovax 23 and  Future for shingrix  .   [facility]  Or so    Plan fasting lab pneumovax 23 and  and results mailed to her  And go from there  Plan in person visit depending .  hcm reviewed today  Counseled.   Expectant management and discussion of plan and treatment with opportunity to ask questions and all were answered. The patient agreed with the plan and demonstrated an understanding of the instructions.   Advised to call back or seek an in-person evaluation if worsening  or having  further concerns . In interim    Shanon Ace, MD

## 2019-04-25 NOTE — Patient Instructions (Addendum)
Health Maintenance Due  Topic Date Due  . PNA vac Low Risk Adult (2 of 2 - PPSV23) 01/26/2017    Depression screen [facility] 2/9 04/25/2019 01/27/2017 01/27/2016  Decreased Interest 0 0 0  Down, Depressed, Hopeless 0 0 0  PHQ - 2 Score 0 0 0

## 2019-04-26 ENCOUNTER — Other Ambulatory Visit: Payer: Self-pay

## 2019-04-26 ENCOUNTER — Other Ambulatory Visit (INDEPENDENT_AMBULATORY_CARE_PROVIDER_SITE_OTHER): Payer: Medicare Other

## 2019-04-26 ENCOUNTER — Telehealth: Payer: Self-pay | Admitting: Internal Medicine

## 2019-04-26 DIAGNOSIS — R7301 Impaired fasting glucose: Secondary | ICD-10-CM

## 2019-04-26 DIAGNOSIS — Z833 Family history of diabetes mellitus: Secondary | ICD-10-CM | POA: Diagnosis not present

## 2019-04-26 DIAGNOSIS — Z23 Encounter for immunization: Secondary | ICD-10-CM

## 2019-04-26 DIAGNOSIS — E785 Hyperlipidemia, unspecified: Secondary | ICD-10-CM

## 2019-04-26 LAB — COMPREHENSIVE METABOLIC PANEL
ALT: 9 U/L (ref 0–35)
AST: 14 U/L (ref 0–37)
Albumin: 4.6 g/dL (ref 3.5–5.2)
Alkaline Phosphatase: 71 U/L (ref 39–117)
BUN: 17 mg/dL (ref 6–23)
CO2: 29 mEq/L (ref 19–32)
Calcium: 9.4 mg/dL (ref 8.4–10.5)
Chloride: 102 mEq/L (ref 96–112)
Creatinine, Ser: 0.76 mg/dL (ref 0.40–1.20)
GFR: 91.47 mL/min (ref >60.00)
Glucose, Bld: 95 mg/dL (ref 70–99)
Potassium: 4.4 mEq/L (ref 3.5–5.1)
Sodium: 138 mEq/L (ref 135–145)
Total Bilirubin: 0.8 mg/dL (ref 0.2–1.2)
Total Protein: 7.1 g/dL (ref 6.0–8.3)

## 2019-04-26 LAB — LIPID PANEL
Cholesterol: 174 mg/dL (ref 0–200)
HDL: 60.4 mg/dL (ref >39.00)
LDL Cholesterol: 101 mg/dL — ABNORMAL HIGH (ref 0–99)
NonHDL: 113.88
Total CHOL/HDL Ratio: 3
Triglycerides: 62 mg/dL (ref 0.0–149.0)
VLDL: 12.4 mg/dL (ref 0.0–40.0)

## 2019-04-26 LAB — HEMOGLOBIN A1C: Hgb A1c MFr Bld: 5.7 % (ref 4.6–6.5)

## 2019-04-26 NOTE — Telephone Encounter (Signed)
Pt stated that she would like to have a copy of her lab results sent to her home the address was confirmed.

## 2019-05-02 NOTE — Telephone Encounter (Signed)
Pt has been notified of results and is being mailed out

## 2019-05-11 ENCOUNTER — Other Ambulatory Visit: Payer: Self-pay

## 2019-05-11 ENCOUNTER — Ambulatory Visit
Admission: RE | Admit: 2019-05-11 | Discharge: 2019-05-11 | Disposition: A | Discharge status: Home / Self Care | Payer: Medicare Other | Source: Ambulatory Visit | Attending: Internal Medicine | Admitting: Internal Medicine

## 2019-05-11 DIAGNOSIS — Z1231 Encounter for screening mammogram for malignant neoplasm of breast: Secondary | ICD-10-CM | POA: Diagnosis not present

## 2019-06-27 ENCOUNTER — Ambulatory Visit (INDEPENDENT_AMBULATORY_CARE_PROVIDER_SITE_OTHER): Payer: Medicare Other

## 2019-06-27 ENCOUNTER — Other Ambulatory Visit: Payer: Self-pay

## 2019-06-27 DIAGNOSIS — Z23 Encounter for immunization: Secondary | ICD-10-CM | POA: Diagnosis not present

## 2019-06-27 NOTE — Progress Notes (Signed)
Patient in clinic today for flu clinic. Flu vaccine administered with no reactions.

## 2019-06-30 ENCOUNTER — Other Ambulatory Visit: Payer: Self-pay

## 2019-07-03 ENCOUNTER — Encounter: Payer: Self-pay | Admitting: Women's Health

## 2019-07-03 ENCOUNTER — Other Ambulatory Visit: Payer: Self-pay

## 2019-07-03 ENCOUNTER — Ambulatory Visit (INDEPENDENT_AMBULATORY_CARE_PROVIDER_SITE_OTHER): Payer: Medicare Other | Admitting: Women's Health

## 2019-07-03 VITALS — BP 124/76 | Ht 66.0 in | Wt 140.0 lb

## 2019-07-03 DIAGNOSIS — Z01419 Encounter for gynecological examination (general) (routine) without abnormal findings: Secondary | ICD-10-CM

## 2019-07-03 DIAGNOSIS — N898 Other specified noninflammatory disorders of vagina: Secondary | ICD-10-CM | POA: Diagnosis not present

## 2019-07-03 LAB — WET PREP FOR TRICH, YEAST, CLUE

## 2019-07-03 MED ORDER — FLUTICASONE PROPIONATE 0.05 % EX CREA: TOPICAL_CREAM | Freq: Two times a day (BID) | CUTANEOUS | 3 refills | Status: DC

## 2019-07-03 MED ORDER — FLUCONAZOLE 150 MG PO TABS: 150.0000 mg | ORAL_TABLET | Freq: Once | ORAL | 0 refills | Status: AC

## 2019-07-03 NOTE — Progress Notes (Signed)
  History:    Presents for breast and pelvic exam with complaint of vaginal odor occasionally.  No visible discharge, itching or pain.  Normal Pap and mammogram history.  Not sexually active husband's health.  2002 TAH with BSO for fibroids on no HRT.  Primary care manages DEXA/2017.  History of degenerative disc disease and macular degeneration.  Vaccines are current.  2014- colonoscopy.  Past medical history, past surgical history, family history and social history were all reviewed and documented in the EPIC chart.  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  ROS:  A ROS was performed and pertinent positives and negatives are included.  Exam:  Vitals:   07/03/19 1026  BP: 124/76  Weight: 140 lb (63.5 kg)  Height: 5\' 6"  (1.676 m)   Body mass index is 22.6 kg/m.   General appearance:  Normal Thyroid:  Symmetrical, normal in size, without palpable masses or nodularity. Respiratory  Auscultation:  Clear without wheezing or rhonchi Cardiovascular  Auscultation:  Regular rate, without rubs, murmurs or gallops  Edema/varicosities:  Not grossly evident Abdominal  Soft,nontender, without masses, guarding or rebound.  Liver/spleen:  No organomegaly noted  Hernia:  None appreciated  Skin  Inspection:  Grossly normal   Breasts: Examined lying and sitting.     Right: Without masses, retractions, discharge or axillary adenopathy.     Left: Without masses, retractions, discharge or axillary adenopathy. Gentitourinary   Inguinal/mons:  Normal without inguinal adenopathy  External genitalia:  Normal  BUS/Urethra/Skene's glands:  Normal  Vagina: Atrophic no visible discharge wet prep positive for yeast   Cervix: And uterus absent   Adnexa/parametria:     Rt: Without masses or tenderness.   Lt: Without masses or tenderness.  Anus and perineum: Normal  Digital rectal exam: Normal sphincter tone without palpated masses or tenderness  Assessment/Plan:  68 y.o. MBF G1, P1 for breast and pelvic  exam.  2002 TAH with BSO for fibroids on no HRT Yeast vaginitis Degenerative disc disease orthopedist manages Labs, DEXA/2017 normal-primary care manages   Plan: Diflucan 150 p.o. x1 dose, instructed to call if continued issues with odor.  Continue healthy lifestyle of regular exercise, encouraged to increase balance type exercise, home safety fall prevention discussed.  SBEs, continue annual screening mammogram, calcium rich foods, vitamin D 2000 daily encouraged.  Pap screening guidelines reviewed.    Dixon, 10:49 AM 07/03/2019

## 2019-07-03 NOTE — Patient Instructions (Signed)
Health Maintenance After Age 68 After age 68, you are at a higher risk for certain long-term diseases and infections as well as injuries from falls. Falls are a major cause of broken bones and head injuries in people who are older than age 68. Getting regular preventive care can help to keep you healthy and well. Preventive care includes getting regular testing and making lifestyle changes as recommended by your health care provider. Talk with your health care provider about:  Which screenings and tests you should have. A screening is a test that checks for a disease when you have no symptoms.  A diet and exercise plan that is right for you. What should I know about screenings and tests to prevent falls? Screening and testing are the best ways to find a health problem early. Early diagnosis and treatment give you the best chance of managing medical conditions that are common after age 68. Certain conditions and lifestyle choices may make you more likely to have a fall. Your health care provider may recommend:  Regular vision checks. Poor vision and conditions such as cataracts can make you more likely to have a fall. If you wear glasses, make sure to get your prescription updated if your vision changes.  Medicine review. Work with your health care provider to regularly review all of the medicines you are taking, including over-the-counter medicines. Ask your health care provider about any side effects that may make you more likely to have a fall. Tell your health care provider if any medicines that you take make you feel dizzy or sleepy.  Osteoporosis screening. Osteoporosis is a condition that causes the bones to get weaker. This can make the bones weak and cause them to break more easily.  Blood pressure screening. Blood pressure changes and medicines to control blood pressure can make you feel dizzy.  Strength and balance checks. Your health care provider may recommend certain tests to check your  strength and balance while standing, walking, or changing positions.  Foot health exam. Foot pain and numbness, as well as not wearing proper footwear, can make you more likely to have a fall.  Depression screening. You may be more likely to have a fall if you have a fear of falling, feel emotionally low, or feel unable to do activities that you used to do.  Alcohol use screening. Using too much alcohol can affect your balance and may make you more likely to have a fall. What actions can I take to lower my risk of falls? General instructions  Talk with your health care provider about your risks for falling. Tell your health care provider if: ? You fall. Be sure to tell your health care provider about all falls, even ones that seem minor. ? You feel dizzy, sleepy, or off-balance.  Take over-the-counter and prescription medicines only as told by your health care provider. These include any supplements.  Eat a healthy diet and maintain a healthy weight. A healthy diet includes low-fat dairy products, low-fat (lean) meats, and fiber from whole grains, beans, and lots of fruits and vegetables. Home safety  Remove any tripping hazards, such as rugs, cords, and clutter.  Install safety equipment such as grab bars in bathrooms and safety rails on stairs.  Keep rooms and walkways well-lit. Activity   Follow a regular exercise program to stay fit. This will help you maintain your balance. Ask your health care provider what types of exercise are appropriate for you.  If you need a cane or   walker, use it as recommended by your health care provider.  Wear supportive shoes that have nonskid soles. Lifestyle  Do not drink alcohol if your health care provider tells you not to drink.  If you drink alcohol, limit how much you have: ? 0-1 drink a day for women. ? 0-2 drinks a day for men.  Be aware of how much alcohol is in your drink. In the [place] one drink equals one typical bottle of beer (12  oz), one-half glass of wine (5 oz), or one shot of hard liquor (1 oz).  Do not use any products that contain nicotine or tobacco, such as cigarettes and e-cigarettes. If you need help quitting, ask your health care provider. Summary  Having a healthy lifestyle and getting preventive care can help to protect your health and wellness after age 68.  Screening and testing are the best way to find a health problem early and help you avoid having a fall. Early diagnosis and treatment give you the best chance for managing medical conditions that are more common for people who are older than age 68.  Falls are a major cause of broken bones and head injuries in people who are older than age 68. Take precautions to prevent a fall at home.  Work with your health care provider to learn what changes you can make to improve your health and wellness and to prevent falls. This information is not intended to replace advice given to you by your health care provider. Make sure you discuss any questions you have with your health care provider. Document Released: 08/11/2017 Document Revised: 01/19/2019 Document Reviewed: 08/11/2017 Elsevier Patient Education  2020 [employer]  

## 2019-07-04 ENCOUNTER — Telehealth: Payer: Self-pay

## 2019-07-04 NOTE — Telephone Encounter (Signed)
Copied from [place] [id number]. Topic: General - Other >> Jul 04, 2019 10:52 AM Pauline Good wrote: Reason for CRM: pt had her 2nd Shingrix shot at [facility]. FYI to be entered in her chart

## 2019-12-05 LAB — CBC AND DIFFERENTIAL
HCT: 40 (ref 36–46)
Hemoglobin: 13.8 (ref 12.0–16.0)
Neutrophils Absolute: 50
Platelets: 189 (ref 150–399)
WBC: 3.8

## 2019-12-05 LAB — VITAMIN D 25 HYDROXY (VIT D DEFICIENCY, FRACTURES): Vit D, 25-Hydroxy: 70.87

## 2019-12-05 LAB — HEMOGLOBIN A1C: Hemoglobin A1C: 5.5

## 2019-12-05 LAB — BASIC METABOLIC PANEL
BUN: 12 (ref 4–21)
CO2: 29 — AB (ref 13–22)
Chloride: 105 (ref 99–108)
Creatinine: 0.8 (ref 0.5–1.1)
Glucose: 96
Potassium: 3.8 (ref 3.4–5.3)
Sodium: 138 (ref 137–147)

## 2019-12-05 LAB — HEPATIC FUNCTION PANEL
ALT: 17 (ref 7–35)
AST: 12 — AB (ref 13–35)
Alkaline Phosphatase: 60 (ref 25–125)
Bilirubin, Direct: 0.2 (ref 0.01–0.4)
Bilirubin, Total: 0.8

## 2019-12-05 LAB — TSH: TSH: 1.8 (ref 0.41–5.90)

## 2019-12-05 LAB — LIPID PANEL
Cholesterol: 170 (ref 0–200)
HDL: 73 — AB (ref 35–70)
LDL Cholesterol: 86
Triglycerides: 56 (ref 40–160)

## 2019-12-05 LAB — COMPREHENSIVE METABOLIC PANEL
Albumin: 3.6 (ref 3.5–5.0)
Calcium: 8.9 (ref 8.7–10.7)

## 2019-12-05 LAB — CBC: RBC: 4.95 (ref 3.87–5.11)

## 2019-12-16 ENCOUNTER — Encounter: Payer: Self-pay | Admitting: Internal Medicine

## 2019-12-16 ENCOUNTER — Telehealth: Payer: Self-pay

## 2019-12-16 NOTE — Telephone Encounter (Signed)
error 

## 2019-12-16 NOTE — Telephone Encounter (Signed)
Pt labs have been abstracted and placed on your desk

## 2019-12-22 NOTE — Telephone Encounter (Signed)
Ms Zoll I received your notes and labs   And reviewed . So  With a supplement  You could easily get too much magnesium  sometimes causes diarrhea   . Im fairly neutral on this if you are feeling  ok . I dont routinely prescribe magnesium supplements in your situation.   But the studies on prevention of diabetes and magnesium are   related to diet high in magnesium and not a supplement.    See quote below   In meta-analyses of prospective cohort studies, there was a lower risk of type 2 diabetes in both men and women with higher magnesium intake [97,110]. Sources of dietary magnesium include nuts, whole grains, and green leafy vegetables.  People with full blown diabetes sometimes develop magnesium deficiency   But that is not your situation fortunately.   So you may want to consider  eating a diet higher in magnesium  Instead of a supplement.

## 2019-12-25 NOTE — Telephone Encounter (Signed)
Message was sent to pt Mychart

## 2020-03-27 ENCOUNTER — Other Ambulatory Visit: Payer: Self-pay | Admitting: Internal Medicine

## 2020-03-27 DIAGNOSIS — Z1231 Encounter for screening mammogram for malignant neoplasm of breast: Secondary | ICD-10-CM

## 2020-05-14 ENCOUNTER — Other Ambulatory Visit: Payer: Self-pay

## 2020-05-14 ENCOUNTER — Ambulatory Visit
Admission: RE | Admit: 2020-05-14 | Discharge: 2020-05-14 | Disposition: A | Discharge status: Home / Self Care | Payer: Medicare Other | Source: Ambulatory Visit | Attending: Internal Medicine | Admitting: Internal Medicine

## 2020-05-14 DIAGNOSIS — Z1231 Encounter for screening mammogram for malignant neoplasm of breast: Secondary | ICD-10-CM | POA: Diagnosis not present

## 2020-07-01 ENCOUNTER — Telehealth: Payer: Self-pay | Admitting: Internal Medicine

## 2020-07-01 NOTE — Telephone Encounter (Signed)
Please advise 

## 2020-07-01 NOTE — Telephone Encounter (Signed)
Pt is calling to see if she should get the flu shot. She had her last COVID vaccine in March.  Please advise

## 2020-07-02 NOTE — Telephone Encounter (Signed)
Called patient and scheduled her for a nurse visit on 07/19/2020.   Patient asked if she needs to have another Pneumonia vaccine?  I see she had the Pneumovax 23 on 04/26/2019  Please advise.

## 2020-07-02 NOTE — Telephone Encounter (Signed)
Patient is aware 

## 2020-07-02 NOTE — Telephone Encounter (Signed)
She has had 2 pna vaccines  And is utd on these  At this time.

## 2020-07-02 NOTE — Telephone Encounter (Signed)
Get the flu shot  .

## 2020-07-19 ENCOUNTER — Ambulatory Visit (INDEPENDENT_AMBULATORY_CARE_PROVIDER_SITE_OTHER): Payer: Medicare Other | Admitting: *Deleted

## 2020-07-19 ENCOUNTER — Other Ambulatory Visit: Payer: Self-pay

## 2020-07-19 DIAGNOSIS — Z23 Encounter for immunization: Secondary | ICD-10-CM

## 2020-08-20 DIAGNOSIS — Z23 Encounter for immunization: Secondary | ICD-10-CM | POA: Diagnosis not present

## 2020-10-18 DIAGNOSIS — Z961 Presence of intraocular lens: Secondary | ICD-10-CM | POA: Diagnosis not present

## 2020-11-21 ENCOUNTER — Ambulatory Visit (INDEPENDENT_AMBULATORY_CARE_PROVIDER_SITE_OTHER): Payer: Medicare Other

## 2020-11-21 DIAGNOSIS — Z0000 Encounter for general adult medical examination without abnormal findings: Secondary | ICD-10-CM | POA: Diagnosis not present

## 2020-11-21 NOTE — Patient Instructions (Signed)
Ms. Elizabeth Rollins , Thank you for taking time to come for your Medicare Wellness Visit. I appreciate your ongoing commitment to your health goals. Please review the following plan we discussed and let me know if I can assist you in the future.   Screening recommendations/referrals: Colonoscopy: Up to date, next due 08/30/2023 Mammogram: Up to date, next due 05/14/2021 Bone Density: Up to date, next due 01/29/2021 Recommended yearly ophthalmology/optometry visit for glaucoma screening and checkup Recommended yearly dental visit for hygiene and checkup  Vaccinations: Influenza vaccine: Up to date, next due fall 2022  Pneumococcal vaccine: Completed series  Tdap vaccine: Currently due, you may receive at your local pharmacy or you may await and injury to receive in our office.  Shingles vaccine: Currently due for your second Shingrix, we recommend that you receive at your local pharmacy.    Advanced directives: Advance directive discussed with you today. Even though you declined this today please call our office should you change your mind and we can give you the proper paperwork for you to fill out.   Conditions/risks identified: None   Next appointment: 11/26/2020 @ 9:30 am with Dr. Regis Bill    Preventive Care 65 Years and Older, Female Preventive care refers to lifestyle choices and visits with your health care provider that can promote health and wellness. What does preventive care include?  A yearly physical exam. This is also called an annual well check.  Dental exams once or twice a year.  Routine eye exams. Ask your health care provider how often you should have your eyes checked.  Personal lifestyle choices, including:  Daily care of your teeth and gums.  Regular physical activity.  Eating a healthy diet.  Avoiding tobacco and drug use.  Limiting alcohol use.  Practicing safe sex.  Taking low-dose aspirin every day.  Taking vitamin and mineral supplements as recommended  by your health care provider. What happens during an annual well check? The services and screenings done by your health care provider during your annual well check will depend on your age, overall health, lifestyle risk factors, and family history of disease. Counseling  Your health care provider may ask you questions about your:  Alcohol use.  Tobacco use.  Drug use.  Emotional well-being.  Home and relationship well-being.  Sexual activity.  Eating habits.  History of falls.  Memory and ability to understand (cognition).  Work and work Statistician.  Reproductive health. Screening  You may have the following tests or measurements:  Height, weight, and BMI.  Blood pressure.  Lipid and cholesterol levels. These may be checked every 5 years, or more frequently if you are over 3 years old.  Skin check.  Lung cancer screening. You may have this screening every year starting at age 59 if you have a 30-pack-year history of smoking and currently smoke or have quit within the past 15 years.  Fecal occult blood test (FOBT) of the stool. You may have this test every year starting at age 68.  Flexible sigmoidoscopy or colonoscopy. You may have a sigmoidoscopy every 5 years or a colonoscopy every 10 years starting at age 28.  Hepatitis C blood test.  Hepatitis B blood test.  Sexually transmitted disease (STD) testing.  Diabetes screening. This is done by checking your blood sugar (glucose) after you have not eaten for a while (fasting). You may have this done every 1-3 years.  Bone density scan. This is done to screen for osteoporosis. You may have this done starting at  age 51.  Mammogram. This may be done every 1-2 years. Talk to your health care provider about how often you should have regular mammograms. Talk with your health care provider about your test results, treatment options, and if necessary, the need for more tests. Vaccines  Your health care provider may  recommend certain vaccines, such as:  Influenza vaccine. This is recommended every year.  Tetanus, diphtheria, and acellular pertussis (Tdap, Td) vaccine. You may need a Td booster every 10 years.  Zoster vaccine. You may need this after age 35.  Pneumococcal 13-valent conjugate (PCV13) vaccine. One dose is recommended after age 20.  Pneumococcal polysaccharide (PPSV23) vaccine. One dose is recommended after age 8. Talk to your health care provider about which screenings and vaccines you need and how often you need them. This information is not intended to replace advice given to you by your health care provider. Make sure you discuss any questions you have with your health care provider. Document Released: 10/25/2015 Document Revised: 06/17/2016 Document Reviewed: 07/30/2015 Elsevier Interactive Patient Education  2017 [place] Prevention in the Home Falls can cause injuries. They can happen to people of all ages. There are many things you can do to make your home safe and to help prevent falls. What can I do on the outside of my home?  Regularly fix the edges of walkways and driveways and fix any cracks.  Remove anything that might make you trip as you walk through a door, such as a raised step or threshold.  Trim any bushes or trees on the path to your home.  Use bright outdoor lighting.  Clear any walking paths of anything that might make someone trip, such as rocks or tools.  Regularly check to see if handrails are loose or broken. Make sure that both sides of any steps have handrails.  Any raised decks and porches should have guardrails on the edges.  Have any leaves, snow, or ice cleared regularly.  Use sand or salt on walking paths during winter.  Clean up any spills in your garage right away. This includes oil or grease spills. What can I do in the bathroom?  Use night lights.  Install grab bars by the toilet and in the tub and shower. Do not use towel  bars as grab bars.  Use non-skid mats or decals in the tub or shower.  If you need to sit down in the shower, use a plastic, non-slip stool.  Keep the floor dry. Clean up any water that spills on the floor as soon as it happens.  Remove soap buildup in the tub or shower regularly.  Attach bath mats securely with double-sided non-slip rug tape.  Do not have throw rugs and other things on the floor that can make you trip. What can I do in the bedroom?  Use night lights.  Make sure that you have a light by your bed that is easy to reach.  Do not use any sheets or blankets that are too big for your bed. They should not hang down onto the floor.  Have a firm chair that has side arms. You can use this for support while you get dressed.  Do not have throw rugs and other things on the floor that can make you trip. What can I do in the kitchen?  Clean up any spills right away.  Avoid walking on wet floors.  Keep items that you use a lot in easy-to-reach places.  If you  need to reach something above you, use a strong step stool that has a grab bar.  Keep electrical cords out of the way.  Do not use floor polish or wax that makes floors slippery. If you must use wax, use non-skid floor wax.  Do not have throw rugs and other things on the floor that can make you trip. What can I do with my stairs?  Do not leave any items on the stairs.  Make sure that there are handrails on both sides of the stairs and use them. Fix handrails that are broken or loose. Make sure that handrails are as long as the stairways.  Check any carpeting to make sure that it is firmly attached to the stairs. Fix any carpet that is loose or worn.  Avoid having throw rugs at the top or bottom of the stairs. If you do have throw rugs, attach them to the floor with carpet tape.  Make sure that you have a light switch at the top of the stairs and the bottom of the stairs. If you do not have them, ask someone to  add them for you. What else can I do to help prevent falls?  Wear shoes that:  Do not have high heels.  Have rubber bottoms.  Are comfortable and fit you well.  Are closed at the toe. Do not wear sandals.  If you use a stepladder:  Make sure that it is fully opened. Do not climb a closed stepladder.  Make sure that both sides of the stepladder are locked into place.  Ask someone to hold it for you, if possible.  Clearly mark and make sure that you can see:  Any grab bars or handrails.  First and last steps.  Where the edge of each step is.  Use tools that help you move around (mobility aids) if they are needed. These include:  Canes.  Walkers.  Scooters.  Crutches.  Turn on the lights when you go into a dark area. Replace any light bulbs as soon as they burn out.  Set up your furniture so you have a clear path. Avoid moving your furniture around.  If any of your floors are uneven, fix them.  If there are any pets around you, be aware of where they are.  Review your medicines with your doctor. Some medicines can make you feel dizzy. This can increase your chance of falling. Ask your doctor what other things that you can do to help prevent falls. This information is not intended to replace advice given to you by your health care provider. Make sure you discuss any questions you have with your health care provider. Document Released: 07/25/2009 Document Revised: 03/05/2016 Document Reviewed: 11/02/2014 Elsevier Interactive Patient Education  2017 [employer].

## 2020-11-21 NOTE — Progress Notes (Signed)
Subjective:   Mckenzey Parcell is a 70 y.o. female who presents for Medicare Annual (Subsequent) preventive examination.  I connected with Siera Beyersdorf today by telephone and verified that I am speaking with the correct person using two identifiers. Location patient: home Location provider: work Persons participating in the virtual visit: patient, provider.   I discussed the limitations, risks, security and privacy concerns of performing an evaluation and management service by telephone and the availability of in person appointments. I also discussed with the patient that there may be a patient responsible charge related to this service. The patient expressed understanding and verbally consented to this telephonic visit.    Interactive audio and video telecommunications were attempted between this provider and patient, however failed, due to patient having technical difficulties OR patient did not have access to video capability.  We continued and completed visit with audio only.      Review of Systems    N/A  Cardiac Risk Factors include: advanced age (>83men, >78 women)     Objective:    Today's Vitals   There is no height or weight on file to calculate BMI.  Advanced Directives 11/21/2020 01/27/2017  Does Patient Have a Medical Advance Directive? No No  Would patient like information on creating a medical advance directive? No - Patient declined -    Current Medications (verified) Outpatient Encounter Medications as of 11/21/2020  Medication Sig  . Ascorbic Acid (VITA-C PO) Take by mouth.  [name] CALCIUM PO Take by mouth.  . Cholecalciferol (VITAMIN D) 2000 UNITS tablet Take 2,000 Units by mouth daily.  . fish oil-omega-3 fatty acids 1000 MG capsule Take 2 g by mouth daily.  [name] MAGNESIUM PO Take 500 mg by mouth 2 (two) times daily.  . [employer] ARTIFICIAL TEARS 1-0.3 % SOLN Place 1 drop into both eyes 4 (four) times daily. (Patient not taking: Reported on 11/21/2020)  . fluticasone  (CUTIVATE) 0.05 % cream Apply topically 2 (two) times daily. (Patient not taking: Reported on 11/21/2020)  . ondansetron (ZOFRAN-ODT) 4 MG disintegrating tablet Take 1 tablet (4 mg total) by mouth every 8 (eight) hours as needed for nausea or vomiting. (Patient not taking: No sig reported)   No facility-administered encounter medications on file as of 11/21/2020.    Allergies (verified) Adhesive [tape] and Sulfonamide derivatives   History: Past Medical History:  Diagnosis Date  . Enlarged uterus 2-02   TAH & BSO  . Miscarriage   . Varicella    hx as a child   Past Surgical History:  Procedure Laterality Date  . ABDOMINAL HYSTERECTOMY  2-02   TAH and BSO for nonmalignant reasons bleeding  . BREAST CYST ASPIRATION Right   . BREAST EXCISIONAL BIOPSY Right   . BREAST SURGERY     Lumpectomy-Benign  . cataract surgery  06/2017   bilatreral  . LAMINECTOMY  2002   and repeat  . LUMBAR DISC SURGERY     x 2  . OOPHORECTOMY     BSO   Family History  Problem Relation Age of Onset  . Pulmonary embolism Mother        died 41  . Hypertension Mother   . Heart attack Father        died 40  . Diabetes Father   . Heart disease Father   . Allergies Sister   . Diabetes Brother   . Diabetes Brother   . Diabetes Sister   . Other Sister  Congestive Heart Failure  . Diabetes Sister   . Breast cancer Maternal Aunt   . Colon cancer Neg Hx    Social History   Socioeconomic History  . Marital status: Married    Spouse name: Not on file  . Number of children: Not on file  . Years of education: Not on file  . Highest education level: Not on file  Occupational History  . Not on file  Tobacco Use  . Smoking status: Former Smoker    Quit date: 10/12/1982    Years since quitting: 38.1  . Smokeless tobacco: Never Used  . Tobacco comment: smoked many years ago and can';t qunatify   Vaping Use  . Vaping Use: Never used  Substance and Sexual Activity  . Alcohol use: No     Alcohol/week: 0.0 standard drinks  . Drug use: No  . Sexual activity: Not Currently    Birth control/protection: Surgical    Comment: declined sexual Hx questions  Other Topics Concern  . 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 Difficulty of Paying Living Expenses: Not hard at all  Food Insecurity: No Food Insecurity  . Worried About Charity fundraiser in the Last Year: Never true  . Ran Out of Food in the Last Year: Never true  Transportation Needs: No Transportation Needs  . Lack of Transportation (Medical): No  . Lack of Transportation (Non-Medical): No  Physical Activity: Inactive  . Days of Exercise per Week: 0 days  . Minutes of Exercise per Session: 0 min  Stress: No Stress Concern Present  . Feeling of Stress : Not at all  Social Connections: Moderately Isolated  . Frequency of Communication with Friends and Family: More than three times a week  . Frequency of Social Gatherings with Friends and Family: Never  . Attends Religious Services: Never  . Active Member of Clubs or Organizations: No  . Attends Archivist Meetings: Never  . Marital Status: Married    Tobacco Counseling Counseling given: Not Answered Comment: smoked many years ago and can';t qunatify    Clinical Intake:  Pre-visit preparation completed: Yes  Pain : No/denies pain     Nutritional Risks: None Diabetes: No  How often do you need to have someone help you when you read instructions, pamphlets, or other written materials from your doctor or pharmacy?: 1 - Never What is the last grade level you completed in school?: High School  Diabetic?No   Interpreter Needed?: No  Information entered by :: [place] of Daily  Living In your present state of health, do you have any difficulty performing the following activities: 11/21/2020  Hearing? N  Vision? N  Difficulty concentrating or making decisions? N  Walking or climbing stairs? N  Dressing or bathing? N  Doing errands, shopping? N  Preparing Food and eating ? N  Using the Toilet? N  In the past six months, have you accidently leaked urine? N  Do you have problems with loss of bowel control? N  Managing your Medications? N  Managing your Finances? N  Housekeeping or managing your Housekeeping? N  Some recent data might be hidden  Patient Care Team: Panosh, Standley Brooking, MD as PCP - General Annamaria Boots Candiss Norse, NP (Inactive) as Nurse Practitioner (Obstetrics and Gynecology) Zadie Rhine Clent Demark, MD as Consulting Physician (Ophthalmology) Rutherford Guys, MD as Consulting Physician (Ophthalmology)  Indicate any recent Medical Services you may have received from other than [facility] providers in the past year (date may be approximate).     Assessment:   This is a routine wellness examination for Vicenta.  Hearing/Vision screen  Hearing Screening   125Hz  250Hz  500Hz  1000Hz  2000Hz  3000Hz  4000Hz  6000Hz  8000Hz   Right ear:           Left ear:           Vision Screening Comments: Patient states gets eyes examined once per year. Wears glasses   Dietary issues and exercise activities discussed: Current Exercise Habits: The patient does not participate in regular exercise at present  Goals    . Exercise 150 min/wk Moderate Activity    . patient     To maintain health       Depression Screen PHQ 2/9 Scores 11/21/2020 04/25/2019 01/27/2017 01/27/2016 01/22/2015  PHQ - 2 Score 0 0 0 0 0    Fall Risk Fall Risk  11/21/2020 01/27/2017 01/27/2016 01/22/2015  Falls in the past year? 0 No No No  Number falls in past yr: 0 - - -  Injury with Fall? 0 - - -  Risk for fall due to : No Fall Risks - - -  Follow up Falls evaluation completed;Falls prevention discussed - - -     FALL RISK PREVENTION PERTAINING TO THE HOME:  Any stairs in or around the home? No  If so, are there any without handrails? No  Home free of loose throw rugs in walkways, pet beds, electrical cords, etc? Yes  Adequate lighting in your home to reduce risk of falls? Yes   ASSISTIVE DEVICES UTILIZED TO PREVENT FALLS:  Life alert? No  Use of a cane, walker or w/c? No  Grab bars in the bathroom? No  Shower chair or bench in shower? No  Elevated toilet seat or a handicapped toilet? No    Cognitive Function: Normal cognitive status assessed by direct observation by this Nurse Health Advisor. No abnormalities found.    MMSE - Mini Mental State Exam 01/27/2017  Not completed: (No Data)        Immunizations Immunization History  Administered Date(s) Administered  . Fluad Quad(high Dose 65+) 06/27/2019, 07/19/2020  . Influenza, High Dose Seasonal PF 07/29/2016  . Influenza,inj,Quad PF,6+ Mos 09/06/2013, 08/20/2015, 06/28/2017  . PFIZER(Purple Top)SARS-COV-2 Vaccination 11/23/2019, 12/18/2019, 08/20/2020  . Pneumococcal Conjugate-13 01/27/2016  . Pneumococcal Polysaccharide-23 04/26/2019  . Td 02/24/2010  . Zoster 04/21/2011  . Zoster Recombinat (Shingrix) 07/04/2019    TDAP status: Due, Education has been provided regarding the importance of this vaccine. Advised may receive this vaccine at local pharmacy or Health Dept. Aware to provide a copy of the vaccination record if obtained from local pharmacy or Health Dept. Verbalized acceptance and understanding.  Flu Vaccine status: Up to date  Pneumococcal vaccine status: Up to date  Covid-19 vaccine status: Completed vaccines  Qualifies for Shingles Vaccine? Yes   Zostavax completed Yes   Shingrix Completed?: Yes  Screening Tests Health Maintenance  Topic Date Due  . TETANUS/TDAP  02/25/2020  . MAMMOGRAM  05/14/2021  . COLONOSCOPY (Pts 45-41yrs Insurance coverage will need to be confirmed)  08/30/2023  . INFLUENZA  VACCINE  Completed  . DEXA SCAN  Completed  .  COVID-19 Vaccine  Completed  . Hepatitis C Screening  Completed  . PNA vac Low Risk Adult  Completed    Health Maintenance  Health Maintenance Due  Topic Date Due  . TETANUS/TDAP  02/25/2020    Colorectal cancer screening: Type of screening: Colonoscopy. Completed 08/29/2013. Repeat every 10 years  Mammogram status: Completed 05/14/2020. Repeat every year  Bone Density status: Completed 01/30/2016. Results reflect: Bone density results: OSTEOPENIA. Repeat every 5 years.  Lung Cancer Screening: (Low Dose CT Chest recommended if Age 91-80 years, 30 pack-year currently smoking OR have quit w/in 15years.) does not qualify.   Lung Cancer Screening Referral: N/A   Additional Screening:  Hepatitis C Screening: does qualify; Completed 09/06/2013  Vision Screening: Recommended annual ophthalmology exams for early detection of glaucoma and other disorders of the eye. Is the patient up to date with their annual eye exam?  Yes  Who is the provider or what is the name of the office in which the patient attends annual eye exams? Dr. Gershon Crane  If pt is not established with a provider, would they like to be referred to a provider to establish care? No .   Dental Screening: Recommended annual dental exams for proper oral hygiene  Community Resource Referral / Chronic Care Management: CRR required this visit?  No   CCM required this visit?  No      Plan:     I have personally reviewed and noted the following in the patient's chart:   . Medical and social history . Use of alcohol, tobacco or illicit drugs  . Current medications and supplements . Functional ability and status . Nutritional status . Physical activity . Advanced directives . List of other physicians . Hospitalizations, surgeries, and ER visits in previous 12 months . Vitals . Screenings to include cognitive, depression, and falls . Referrals and appointments  In  addition, I have reviewed and discussed with patient certain preventive protocols, quality metrics, and best practice recommendations. A written personalized care plan for preventive services as well as general preventive health recommendations were provided to patient.     Ofilia Neas, LPN   01/08/1915   Nurse Notes: None

## 2020-11-26 ENCOUNTER — Ambulatory Visit: Payer: Medicare Other | Admitting: Internal Medicine

## 2020-11-26 ENCOUNTER — Encounter: Payer: Self-pay | Admitting: Internal Medicine

## 2020-11-26 ENCOUNTER — Ambulatory Visit (INDEPENDENT_AMBULATORY_CARE_PROVIDER_SITE_OTHER): Payer: Medicare Other | Admitting: Internal Medicine

## 2020-11-26 ENCOUNTER — Other Ambulatory Visit: Payer: Self-pay

## 2020-11-26 VITALS — BP 124/76 | HR 73 | Temp 97.9°F | Wt 134.8 lb

## 2020-11-26 DIAGNOSIS — Z79899 Other long term (current) drug therapy: Secondary | ICD-10-CM

## 2020-11-26 DIAGNOSIS — Z833 Family history of diabetes mellitus: Secondary | ICD-10-CM | POA: Diagnosis not present

## 2020-11-26 DIAGNOSIS — E2839 Other primary ovarian failure: Secondary | ICD-10-CM

## 2020-11-26 DIAGNOSIS — L819 Disorder of pigmentation, unspecified: Secondary | ICD-10-CM | POA: Diagnosis not present

## 2020-11-26 DIAGNOSIS — E785 Hyperlipidemia, unspecified: Secondary | ICD-10-CM

## 2020-11-26 DIAGNOSIS — R7301 Impaired fasting glucose: Secondary | ICD-10-CM

## 2020-11-26 LAB — BASIC METABOLIC PANEL
BUN: 11 mg/dL (ref 6–23)
CO2: 31 mEq/L (ref 19–32)
Calcium: 9.6 mg/dL (ref 8.4–10.5)
Chloride: 102 mEq/L (ref 96–112)
Creatinine, Ser: 0.7 mg/dL (ref 0.40–1.20)
GFR: 87.9 mL/min (ref >60.00)
Glucose, Bld: 97 mg/dL (ref 70–99)
Potassium: 3.9 mEq/L (ref 3.5–5.1)
Sodium: 139 mEq/L (ref 135–145)

## 2020-11-26 LAB — HEMOGLOBIN A1C: Hgb A1c MFr Bld: 6 % (ref 4.6–6.5)

## 2020-11-26 LAB — MAGNESIUM: Magnesium: 2.1 mg/dL (ref 1.5–2.5)

## 2020-11-26 MED ORDER — KETOCONAZOLE 2 % EX CREA
1.0000 | TOPICAL_CREAM | Freq: Two times a day (BID) | CUTANEOUS | 1 refills | Status: DC
Start: 2020-11-26 — End: 2022-05-20

## 2020-11-26 NOTE — Patient Instructions (Addendum)
Get appt for  Bone density .  Any time  Order has been placed. Continue lifestyle intervention healthy eating and exercise .   Can repeat hg a1c today .    We will treat as if    TV  With topical  Antifungal cream  For 1-2 weeks  If not helpful see dermatology .  Consider Amy mc michael.   [email or web address] [place], [place], [place] [address]  ~31.8 mi [phone]

## 2020-11-26 NOTE — Progress Notes (Signed)
Chief Complaint  Patient presents with  . Follow-up    HPI: Elizabeth Rollins 70 y.o. come in for Chronic disease management  Yearly exam  Last visit was 2020 Has shingles vaccine 2#  Wellness eval  Recently   hh of 2 no pets .  Activity walks  Sleep : not well.   Elizabeth Rollins Has had her lab work done in August at the [place] has it to review. Wants to make sure sugar levels have not increased. Has area upper arm that is splotchy pigmentation changes Sister has similar had some medicine skin is sensitive. No other acute finding. Takes magnesium now oxide for relaxation and other no se  No diarrhea  ROS: See pertinent positives and negatives per HPI.NO cvsx   Past Medical History:  Diagnosis Date  . Enlarged uterus 2-02   TAH & BSO  . Miscarriage   . Varicella    hx as a child    Family History  Problem Relation Age of Onset  . Pulmonary embolism Mother        died 72  . Hypertension Mother   . Heart attack Father        died 45  . Diabetes Father   . Heart disease Father   . Allergies Sister   . Diabetes Brother   . Diabetes Brother   . Diabetes Sister   . Other Sister        Congestive Heart Failure  . Diabetes Sister   . Breast cancer Maternal Aunt   . Colon cancer Neg Hx     Social History   Socioeconomic History  . Marital status: Married    Spouse name: Not on file  . Number of children: Not on file  . Years of education: Not on file  . Highest education level: Not on file  Occupational History  . Not on file  Tobacco Use  . Smoking status: Former Smoker    Quit date: 10/12/1982    Years since quitting: 38.1  . Smokeless tobacco: Never Used  . Tobacco comment: smoked many years ago and can';t qunatify   Vaping Use  . Vaping Use: Never used  Substance and Sexual Activity  . Alcohol use: No    Alcohol/week: 0.0 standard drinks  . Drug use: No  . Sexual activity: Not Currently    Birth control/protection: Surgical    Comment: declined sexual Hx questions   Other Topics Concern  . 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 Difficulty of Paying Living Expenses: Not hard at all  Food Insecurity: No Food Insecurity  . Worried About Charity fundraiser in the Last Year: Never true  . Ran Out of Food in the Last Year: Never true  Transportation Needs: No Transportation Needs  . Lack of Transportation (Medical): No  . Lack of Transportation (Non-Medical): No  Physical Activity: Inactive  . Days of Exercise per Week: 0 days  . Minutes of Exercise per Session: 0 min  Stress: No Stress Concern Present  . Feeling of Stress : Not at all  Social Connections: Moderately Isolated  . Frequency of Communication with Friends and Family: More than  three times a week  . Frequency of Social Gatherings with Friends and Family: Never  . Attends Religious Services: Never  . Active Member of Clubs or Organizations: No  . Attends Archivist Meetings: Never  . Marital Status: Married    Outpatient Medications Prior to Visit  Medication Sig Dispense Refill  . Ascorbic Acid (VITA-C PO) Take by mouth.    Elizabeth Rollins CALCIUM PO Take by mouth.    . Cholecalciferol (VITAMIN D) 2000 UNITS tablet Take 2,000 Units by mouth daily.    . CVS ARTIFICIAL TEARS 1-0.3 % SOLN Place 1 drop into both eyes 4 (four) times daily.  6  . fish oil-omega-3 fatty acids 1000 MG capsule Take 2 g by mouth daily.    Elizabeth Rollins MAGNESIUM PO Take 500 mg by mouth 2 (two) times daily.    . fluticasone (CUTIVATE) 0.05 % cream Apply topically 2 (two) times daily. (Patient not taking: Reported on 11/21/2020) 30 g 3  . ondansetron (ZOFRAN-ODT) 4 MG disintegrating tablet Take 1 tablet (4 mg total) by mouth every 8 (eight) hours as needed for nausea or vomiting.  (Patient not taking: No sig reported) 20 tablet 0   No facility-administered medications prior to visit.     EXAM:  BP 124/76 (BP Location: Left Arm, Patient Position: Sitting, Cuff Size: Normal)   Pulse 73   Temp 97.9 F (36.6 C) (Oral)   Wt 134 lb 12.8 oz (61.1 kg)   SpO2 98%   BMI 21.76 kg/m   Body mass index is 21.76 kg/m.  GENERAL: vitals reviewed and listed above, alert, oriented, appears well hydrated and in no acute distress HEENT: atraumatic, conjunctiva  clear, no obvious abnormalities on inspection of external nose and ears OP masked  NECK: no obvious masses on inspection palpation  LUNGS: clear to auscultation bilaterally, no wheezes, rales or rhonchi, good air movement CV: HRRR, no clubbing cyanosis or  peripheral edema nl cap refill  MS: moves all extremities without noticeable focal  Abnormality Skin some blotchy hyper hypopigmented area left upper arm. No scale is obvious. Otherwise age changes. PSYCH: pleasant and cooperative, no obvious depression or anxiety Lab Results  Component Value Date   WBC 3.8 12/05/2019   HGB 13.8 12/05/2019   HCT 40 12/05/2019   PLT 189 12/05/2019   GLUCOSE 97 11/26/2020   CHOL 170 12/05/2019   TRIG 56 12/05/2019   HDL 73 (A) 12/05/2019   LDLCALC 86 12/05/2019   ALT 17 12/05/2019   AST 12 (A) 12/05/2019   NA 139 11/26/2020   K 3.9 11/26/2020   CL 102 11/26/2020   CREATININE 0.70 11/26/2020   BUN 11 11/26/2020   CO2 31 11/26/2020   TSH 1.80 12/05/2019   HGBA1C 6.0 11/26/2020   BP Readings from Last 3 Encounters:  11/26/20 124/76  07/03/19 124/76  05/10/18 120/80  Review lab work from the [place] and will abstract or have it abstracted into the system.  ASSESSMENT AND PLAN:  Discussed the following assessment and plan:  Fasting hyperglycemia - Plan: Hemoglobin V4Q, Basic metabolic panel, Magnesium, Magnesium, Hemoglobin V9D, Basic metabolic panel  Family history of diabetes mellitus - Plan: Hemoglobin G3O, Basic  metabolic panel, Magnesium, Magnesium, Hemoglobin V5I, Basic metabolic panel  Hyperlipidemia, unspecified hyperlipidemia type - Plan: Hemoglobin E3P, Basic metabolic panel, Magnesium, Magnesium, Hemoglobin I9J, Basic metabolic panel  Estrogen deficiency - Plan: DG Bone Density, Hemoglobin J8A, Basic metabolic panel, Magnesium, Magnesium, Hemoglobin C1Y, Basic metabolic panel  Medication management - Plan: Hemoglobin  S1J, Basic metabolic panel, Magnesium, Magnesium, Hemoglobin D5Z, Basic metabolic panel  Pigmentation abnormality of skin - Upper arms consider TV but not typical empiric topical treatment with ketoconazole advise dermatology consult no alarming findings does not appear autoimmune utd Healthcare parameters but follow-up DEXA is appropriate. She has had a high HDL in the past which is favorable We will continue healthy lifestyle appropriate diet we can follow her A1c's over time.  She is these that she does not need to be placed on medication. -Patient advised to return or notify health care team  if  new concerns arise. 32 minutes review out side labs record and counsel plan  .[facility] counsel  And plan  30 minutes    Patient Instructions  Get appt for  Bone density .  Any time  Order has been placed. Continue lifestyle intervention healthy eating and exercise .   Can repeat hg a1c today .    We will treat as if    TV  With topical  Antifungal cream  For 1-2 weeks  If not helpful see dermatology .  Consider Amy mc michael.   [email or web address] [place], [place], [place] [address]  ~31.8 mi [phone]     Standley Brooking. Michaiah Maiden M.D.

## 2020-11-29 ENCOUNTER — Ambulatory Visit (INDEPENDENT_AMBULATORY_CARE_PROVIDER_SITE_OTHER)
Admission: RE | Admit: 2020-11-29 | Discharge: 2020-11-29 | Disposition: A | Discharge status: Home / Self Care | Payer: Medicare Other | Source: Ambulatory Visit

## 2020-11-29 ENCOUNTER — Other Ambulatory Visit: Payer: Self-pay

## 2020-11-29 DIAGNOSIS — E2839 Other primary ovarian failure: Secondary | ICD-10-CM | POA: Diagnosis not present

## 2020-11-30 NOTE — Progress Notes (Signed)
Normal range labs   a1c is a bit up for you . ( but not diabetes )Suggest  we get a repeat  ( we can do this  his in office  as  finger prick test  in about 6 months  . Or wait  till you get repeat at the [place]. These findings are not worrisome .  Continue lifestyle intervention healthy eating and exercise . As possible  and you should do well.

## 2020-12-01 NOTE — Progress Notes (Signed)
Slight decrease in bone density but still  not in high risk or osteoporosis level .  Continue  weight bearing exercise  adequate vitamin D.  Can repeat in 3-4 years as indicated

## 2021-02-10 DIAGNOSIS — Z23 Encounter for immunization: Secondary | ICD-10-CM | POA: Diagnosis not present

## 2021-02-19 ENCOUNTER — Other Ambulatory Visit: Payer: Self-pay

## 2021-02-20 ENCOUNTER — Encounter: Payer: Self-pay | Admitting: Family Medicine

## 2021-02-20 ENCOUNTER — Ambulatory Visit (INDEPENDENT_AMBULATORY_CARE_PROVIDER_SITE_OTHER): Payer: Medicare Other | Admitting: Family Medicine

## 2021-02-20 VITALS — BP 110/76 | HR 65 | Temp 98.1°F | Wt 135.6 lb

## 2021-02-20 DIAGNOSIS — N6321 Unspecified lump in the left breast, upper outer quadrant: Secondary | ICD-10-CM

## 2021-02-20 NOTE — Progress Notes (Signed)
   Subjective:    Patient ID: Elizabeth Rollins, female    DOB: 03-23-1951, 70 y.o.   MRN: [id number]  HPI Here for a tender lump she felt in the left breast area about one week ago. This came up shortly after she received her 4 th Covid vaccine (2nd booster) in the left arm on 02-10-21. No fever or body aches. The lump is now the same size but not quite as tender. Her last mammogram on 05-14-20 was normal.    Review of Systems  Constitutional: Negative.   Respiratory: Negative.   Cardiovascular: Negative.        Objective:   Physical Exam Constitutional:      Appearance: Normal appearance.  Cardiovascular:     Rate and Rhythm: Normal rate and regular rhythm.     Pulses: Normal pulses.     Heart sounds: Normal heart sounds.  Pulmonary:     Effort: Pulmonary effort is normal.     Breath sounds: Normal breath sounds.  Genitourinary:    Comments: The right breast and axilla are normal. The left breast has a normal appearance. There is a well defined, round, tender lump in the left breast at the 2 o'clock position, about 8 cm from the nipple. This is close to the anterior left axillary line. The remainder of the left axilla is clear  Neurological:     Mental Status: She is alert.           Assessment & Plan:  Left breast lump. It is possible that this is a reactive lymph node, which is the result of her recent Covid vaccine. We will set up a diagnostic mammogram and US of the left breast asap.  Alysia Penna, MD

## 2021-02-25 ENCOUNTER — Other Ambulatory Visit: Payer: Self-pay

## 2021-02-25 ENCOUNTER — Ambulatory Visit
Admission: RE | Admit: 2021-02-25 | Discharge: 2021-02-25 | Disposition: A | Discharge status: Home / Self Care | Payer: Medicare Other | Source: Ambulatory Visit | Attending: Family Medicine | Admitting: Family Medicine

## 2021-02-25 DIAGNOSIS — N6489 Other specified disorders of breast: Secondary | ICD-10-CM | POA: Diagnosis not present

## 2021-02-25 DIAGNOSIS — N6321 Unspecified lump in the left breast, upper outer quadrant: Secondary | ICD-10-CM

## 2021-02-25 DIAGNOSIS — R922 Inconclusive mammogram: Secondary | ICD-10-CM | POA: Diagnosis not present

## 2021-06-05 ENCOUNTER — Other Ambulatory Visit: Payer: Self-pay | Admitting: Internal Medicine

## 2021-06-05 DIAGNOSIS — Z1231 Encounter for screening mammogram for malignant neoplasm of breast: Secondary | ICD-10-CM

## 2021-06-10 ENCOUNTER — Other Ambulatory Visit: Payer: Self-pay

## 2021-06-10 ENCOUNTER — Ambulatory Visit
Admission: RE | Admit: 2021-06-10 | Discharge: 2021-06-10 | Disposition: A | Discharge status: Home / Self Care | Payer: Medicare Other | Source: Ambulatory Visit | Attending: Internal Medicine | Admitting: Internal Medicine

## 2021-06-10 DIAGNOSIS — Z1231 Encounter for screening mammogram for malignant neoplasm of breast: Secondary | ICD-10-CM | POA: Diagnosis not present

## 2021-07-17 DIAGNOSIS — Z23 Encounter for immunization: Secondary | ICD-10-CM | POA: Diagnosis not present

## 2021-10-20 DIAGNOSIS — Z961 Presence of intraocular lens: Secondary | ICD-10-CM | POA: Diagnosis not present

## 2021-11-18 ENCOUNTER — Telehealth: Payer: Self-pay | Admitting: Internal Medicine

## 2021-11-18 NOTE — Telephone Encounter (Signed)
Left message for patient to call back and schedule Medicare Annual Wellness Visit (AWV) either virtually or in office. Left  my Herbie Drape number [phone]   Last AWV ;11/21/20  please schedule at anytime with [facility] Nurse Health Advisor 1 or 2   This should be a 45 minute visit.

## 2021-12-03 LAB — HEMOGLOBIN A1C: Hemoglobin A1C: 6.1

## 2021-12-09 LAB — HEPATIC FUNCTION PANEL
ALT: 19 U/L (ref 7–35)
AST: 12 — AB (ref 13–35)
Alkaline Phosphatase: 94 (ref 25–125)
Bilirubin, Direct: 0.2 (ref 0.01–0.4)
Bilirubin, Total: 1

## 2021-12-09 LAB — CBC AND DIFFERENTIAL
HCT: 40 (ref 36–46)
Hemoglobin: 14.4 (ref 12.0–16.0)
Platelets: 174 10*3/uL (ref 150–400)
WBC: 4.8

## 2021-12-09 LAB — COMPREHENSIVE METABOLIC PANEL
Calcium: 9.8 (ref 8.7–10.7)
eGFR: 94

## 2021-12-09 LAB — CBC: RBC: 5.07 (ref 3.87–5.11)

## 2021-12-09 LAB — VITAMIN B12: Vitamin B-12: 256

## 2021-12-09 LAB — BASIC METABOLIC PANEL
CO2: 27 — AB (ref 13–22)
Chloride: 107 (ref 99–108)
Glucose: 98
Potassium: 3.7 mEq/L (ref 3.5–5.1)
Sodium: 138 (ref 137–147)

## 2021-12-09 LAB — TSH: TSH: 1.44 (ref 0.41–5.90)

## 2021-12-09 LAB — VITAMIN D 25 HYDROXY (VIT D DEFICIENCY, FRACTURES): Vit D, 25-Hydroxy: 49.11

## 2021-12-09 LAB — LIPID PANEL
Cholesterol: 170 (ref 0–200)
HDL: 73 — AB (ref 35–70)
LDL Cholesterol: 87
Triglycerides: 50 (ref 40–160)

## 2022-01-09 ENCOUNTER — Telehealth: Payer: Self-pay | Admitting: Internal Medicine

## 2022-01-09 NOTE — Telephone Encounter (Signed)
Left message for patient to call back and schedule Medicare Annual Wellness Visit (AWV) either virtually or in office. Left  my Herbie Drape number [phone] ? ? ?Last AWV ;11/21/20 ? please schedule at anytime with [facility] Nurse Health Advisor 1 or 2 ? ? ? ?

## 2022-01-28 ENCOUNTER — Telehealth: Payer: Self-pay | Admitting: Internal Medicine

## 2022-01-28 NOTE — Telephone Encounter (Signed)
Left message for patient to call back and schedule Medicare Annual Wellness Visit (AWV) either virtually or in office. Left  my Herbie Drape number [phone] ? ? ?Last AWV 11/21/20 ? please schedule at anytime with [facility] Nurse Health Advisor 1 or 2 ? ? ?

## 2022-01-29 ENCOUNTER — Telehealth: Payer: Self-pay | Admitting: Internal Medicine

## 2022-01-29 NOTE — Telephone Encounter (Signed)
Elizabeth Rollins make sure  put in green folder when she brings it

## 2022-01-29 NOTE — Telephone Encounter (Signed)
Patient returned my call  ?She stated she had her awv done @ the [place] ?

## 2022-01-29 NOTE — Telephone Encounter (Signed)
Noted  

## 2022-01-29 NOTE — Telephone Encounter (Signed)
Spoke to patient to schedule her AWV.  She stated she had this done @ the [place] and she will be coming by today to give you a copy of the labs she had done ?

## 2022-01-30 NOTE — Telephone Encounter (Signed)
Forms were received today and placed in green folder  ?

## 2022-05-20 ENCOUNTER — Encounter: Payer: Self-pay | Admitting: Internal Medicine

## 2022-05-20 ENCOUNTER — Ambulatory Visit (INDEPENDENT_AMBULATORY_CARE_PROVIDER_SITE_OTHER): Payer: Medicare Other | Admitting: Internal Medicine

## 2022-05-20 VITALS — BP 110/70 | HR 76 | Temp 98.5°F | Ht 67.0 in | Wt 135.2 lb

## 2022-05-20 DIAGNOSIS — J329 Chronic sinusitis, unspecified: Secondary | ICD-10-CM | POA: Diagnosis not present

## 2022-05-20 DIAGNOSIS — R0981 Nasal congestion: Secondary | ICD-10-CM | POA: Diagnosis not present

## 2022-05-20 DIAGNOSIS — R067 Sneezing: Secondary | ICD-10-CM

## 2022-05-20 NOTE — Patient Instructions (Signed)
I think you may have  sinusiits triggered by allergy or other respiratory irritants Ears look normal .   Begin  OTC antihistamine  daily such as zyrtec or allegra( generic ok )   And daily nasal steroid spray either  flonase or nasacort  2 sprays in each nostril  once a day   for at least 14 days   if helping continue  daily .   Add saline sprays for nose moisture   Can use the OTC decongestant such as afrin occassionally no more than 23- days in a row to prevent  rebound congestion.  If  acting like infection we may add antiboitic and oral steroid such as prdnisone  but I think that control with  above will be the best for now.   If  persistent or progressive we can consider other  interventions allergy ent eval.

## 2022-05-20 NOTE — Progress Notes (Signed)
Chief Complaint  Patient presents with   Sinusitis    X 3 weeks    HPI: Elizabeth Rollins 71 y.o. come in for concer about chornic sinus congestion that began about 3 weeks ago.  There is no associated fever or cold symptoms although she has had a good bit of sneezing. Symptoms are ongoing particularly evolving. No past history of documented allergy Took benadryl allergy .    And last week  .  Helped for one day and then back .  When off medicine stuffy and headaches with this.  Looses voice   but not a lot of cough but throat clearing  When blows nose    heels this.  Sneezing.  No pets.  No travel.  Helped for a day .  ROS: See pertinent positives and negatives per HPI.  No asthma symptoms some hoarseness possibly from postnasal drainage.  Does have a headache with this.  Mostly on the right side and face pressure  Past Medical History:  Diagnosis Date   Enlarged uterus 2-02   TAH & BSO   Miscarriage    Varicella    hx as a child    Family History  Problem Relation Age of Onset   Pulmonary embolism Mother        died 38   Hypertension Mother    Heart attack Father        died 20   Diabetes Father    Heart disease Father    Allergies Sister    Diabetes Brother    Diabetes Brother    Diabetes Sister    Other Sister        Congestive Heart Failure   Diabetes Sister    Breast cancer Maternal Aunt    Colon cancer Neg Hx     Social History   Socioeconomic History   Marital status: Married    Spouse name: Not on file   Number of children: Not on file   Years of education: Not on file   Highest education level: Not on file  Occupational History   Not on file  Tobacco Use   Smoking status: Former    Types: Cigarettes    Quit date: 10/12/1982    Years since quitting: 39.6   Smokeless tobacco: Never   Tobacco comments:    smoked many years ago and can';t qunatify   Vaping Use   Vaping Use: Never used  Substance and Sexual Activity   Alcohol use: No     Alcohol/week: 0.0 standard drinks of alcohol   Drug use: No   Sexual activity: Not Currently    Birth control/protection: Surgical    Comment: declined sexual Hx questions  Other Topics Concern   Not on file  Social History Narrative   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    Am tobacco   Regular exercise- yes water aerobic    hh of 2  Husband retiring   2017       Social Determinants of Health   Financial Resource Strain: Low Risk  (11/21/2020)   Overall Financial Resource Strain (CARDIA)    Difficulty of Paying Living Expenses: Not hard at all  Food Insecurity: No Food Insecurity (11/21/2020)   Hunger Vital Sign    Worried About Running Out of Food in the Last Year: Never true    Ran Out of Food in the Last Year: Never true  Transportation Needs: No Transportation Needs (11/21/2020)  PRAPARE - Hydrologist (Medical): No    Lack of Transportation (Non-Medical): No  Physical Activity: Inactive (11/21/2020)   Exercise Vital Sign    Days of Exercise per Week: 0 days    Minutes of Exercise per Session: 0 min  Stress: No Stress Concern Present (11/21/2020)   [place]    Feeling of Stress : Not at all  Social Connections: Moderately Isolated (11/21/2020)   Social Connection and Isolation Panel [NHANES]    Frequency of Communication with Friends and Family: More than three times a week    Frequency of Social Gatherings with Friends and Family: Never    Attends Religious Services: Never    Marine scientist or Organizations: No    Attends Archivist Meetings: Never    Marital Status: Married    Outpatient Medications Prior to Visit  Medication Sig Dispense Refill   Ascorbic Acid (VITA-C PO) Take by mouth.     CALCIUM PO Take by mouth.     Cholecalciferol (VITAMIN D) 2000 UNITS tablet Take 2,000 Units by mouth daily.     [facility]  ARTIFICIAL TEARS 1-0.3 % SOLN Place 1 drop into both eyes 4 (four) times daily.  6   fish oil-omega-3 fatty acids 1000 MG capsule Take 2 g by mouth daily.     MAGNESIUM PO Take 500 mg by mouth 2 (two) times daily.     ketoconazole (NIZORAL) 2 % cream Apply 1 application topically 2 (two) times daily. 30 g 1   No facility-administered medications prior to visit.     EXAM:  BP 110/70 (BP Location: Left Arm, Patient Position: Sitting, Cuff Size: Normal)   Pulse 76   Temp 98.5 F (36.9 C) (Oral)   Ht '5\' 7"'$  (1.702 m)   Wt 135 lb 3.2 oz (61.3 kg)   SpO2 98%   BMI 21.18 kg/m   Body mass index is 21.18 kg/m.  GENERAL: vitals reviewed and listed above, alert, oriented, appears well hydrated and in no acute distress moderately congested looks allergic HEENT: atraumatic, conjunctiva  clear, no obvious abnormalities on inspection of external nose and ears TMs are clear with normal landmarks turbinates +2 OP : no lesion edema or exudate face nontender ampulla artery nontender NECK: no obvious masses on inspection palpation  LUNGS: clear to auscultation bilaterally, no wheezes, rales or rhonchi, good air movement CV: HRRR, no clubbing cyanosis or  peripheral edema nl cap refill  MS: moves all extremities without noticeable focal  abnormality PSYCH: pleasant and cooperative, no obvious depression or anxiety  BP Readings from Last 3 Encounters:  05/20/22 110/70  02/20/21 110/76  11/26/20 124/76    ASSESSMENT AND PLAN:  Discussed the following assessment and plan:  Nasal sinus congestion  Sinusitis, unspecified chronicity, unspecified location  Sneezing Symptoms do not appear to be bacterial or post viral possibly allergies with a history of sneezing. Nasal congestion seems to be a significant part Take daily antihistamine and nasal steroids and saline as needed Afrin as a rescue. She will let us know how you are doing in the 7 to 10 days or if worse make a decision about further  intervention at that time. Consideration of adding antibiotic and steroid if ongoing referral if appropriate afterwards. -Patient advised to return or notify health care team  if  new concerns arise. Also reviewed previous labs that she sent to [place] in February favorable lipids with high  HDL A1c 6.1 and stable Patient Instructions  I think you may have  sinusiits triggered by allergy or other respiratory irritants Ears look normal .   Begin  OTC antihistamine  daily such as zyrtec or allegra( generic ok )   And daily nasal steroid spray either  flonase or nasacort  2 sprays in each nostril  once a day   for at least 14 days   if helping continue  daily .   Add saline sprays for nose moisture   Can use the OTC decongestant such as afrin occassionally no more than 23- days in a row to prevent  rebound congestion.  If  acting like infection we may add antiboitic and oral steroid such as prdnisone  but I think that control with  above will be the best for now.   If  persistent or progressive we can consider other  interventions allergy ent eval.    Standley Brooking. Rmani Kapusta M.D.

## 2022-05-21 ENCOUNTER — Encounter: Payer: Self-pay | Admitting: Internal Medicine

## 2022-07-14 ENCOUNTER — Other Ambulatory Visit: Payer: Self-pay | Admitting: Internal Medicine

## 2022-07-14 DIAGNOSIS — Z23 Encounter for immunization: Secondary | ICD-10-CM | POA: Diagnosis not present

## 2022-07-14 DIAGNOSIS — Z1231 Encounter for screening mammogram for malignant neoplasm of breast: Secondary | ICD-10-CM

## 2022-08-12 ENCOUNTER — Ambulatory Visit
Admission: EM | Admit: 2022-08-12 | Discharge: 2022-08-12 | Disposition: A | Discharge status: Home / Self Care | Payer: Medicare Other | Attending: Physician Assistant | Admitting: Physician Assistant

## 2022-08-12 ENCOUNTER — Ambulatory Visit (INDEPENDENT_AMBULATORY_CARE_PROVIDER_SITE_OTHER): Payer: Medicare Other

## 2022-08-12 DIAGNOSIS — S62102A Fracture of unspecified carpal bone, left wrist, initial encounter for closed fracture: Secondary | ICD-10-CM

## 2022-08-12 DIAGNOSIS — S62101A Fracture of unspecified carpal bone, right wrist, initial encounter for closed fracture: Secondary | ICD-10-CM

## 2022-08-12 DIAGNOSIS — M25532 Pain in left wrist: Secondary | ICD-10-CM | POA: Diagnosis not present

## 2022-08-12 DIAGNOSIS — M25531 Pain in right wrist: Secondary | ICD-10-CM | POA: Diagnosis not present

## 2022-08-12 DIAGNOSIS — S52135A Nondisplaced fracture of neck of left radius, initial encounter for closed fracture: Secondary | ICD-10-CM | POA: Diagnosis not present

## 2022-08-12 DIAGNOSIS — W19XXXA Unspecified fall, initial encounter: Secondary | ICD-10-CM | POA: Diagnosis not present

## 2022-08-12 NOTE — ED Provider Notes (Signed)
[name]    CSN: [id number] Arrival date & time: 08/12/22  1502      History   Chief Complaint Chief Complaint  Patient presents with   Fall    HPI Elizabeth Rollins is a 71 y.o. female.   Patient here today for evaluation of bilateral wrist pain, left knee pain after a fall earlier today.  She notes that she was in the parking lot and is not sure what she tripped over but states she fell onto her outstretched left hand.  She now has significant pain in her left wrist with associated swelling.  Movement worsens pain.  She notes only moderate pain in her right wrist and has very mild pain in her left knee but is able to ambulate without difficulty.  She denies any numbness or tingling.  She does not report treatment for symptoms.  The history is provided by the patient.  Fall Pertinent negatives include no abdominal pain.    Past Medical History:  Diagnosis Date   Enlarged uterus 2-02   TAH & BSO   Miscarriage    Varicella    hx as a child    Patient Active Problem List   Diagnosis Date Noted   Cystoid macular edema of both eyes 12/29/2017   Hyperglycemia 02/02/2016   Cervical paraspinal muscle spasm 11/22/2013   Wears glasses 07/01/2012   Encounter for preventive health examination 04/07/2011   Screening for colon cancer 04/07/2011   Family history of diabetes mellitus 04/07/2011   Pain in limb 01/20/2010   DEGENERATIVE DISC DISEASE, CERVICAL SPINE, W/RADICULOPATHY 05/13/2009   HIP PAIN, RIGHT 02/11/2009   CAPILLARY HEMANGIOMA 03/29/2008    Past Surgical History:  Procedure Laterality Date   ABDOMINAL HYSTERECTOMY  2-02   TAH and BSO for nonmalignant reasons bleeding   BREAST CYST ASPIRATION Right    BREAST EXCISIONAL BIOPSY Right    BREAST SURGERY     Lumpectomy-Benign   cataract surgery  06/2017   bilatreral   LAMINECTOMY  2002   and repeat   LUMBAR DISC SURGERY     x 2   OOPHORECTOMY     BSO    OB History     Gravida  1    Para  0   Term      Preterm      AB  1   Living  0      SAB  1   IAB      Ectopic      Multiple      Live Births               Home Medications    Prior to Admission medications   Medication Sig Start Date End Date Taking? Authorizing Provider  Ascorbic Acid (VITA-C PO) Take by mouth.    [provider]  CALCIUM PO Take by mouth.    [provider]  Cholecalciferol (VITAMIN D) 2000 UNITS tablet Take 2,000 Units by mouth daily.    [provider]  [employer]S ARTIFICIAL TEARS 1-0.3 % SOLN Place 1 drop into both eyes 4 (four) times daily. 08/02/17   [provider]  fish oil-omega-3 fatty acids 1000 MG capsule Take 2 g by mouth daily.    [provider]  MAGNESIUM PO Take 500 mg by mouth 2 (two) times daily.    [provider]    Family History Family History  Problem Relation Age of Onset   Pulmonary embolism Mother  died 60   Hypertension Mother    Heart attack Father        died 40   Diabetes Father    Heart disease Father    Allergies Sister    Diabetes Brother    Diabetes Brother    Diabetes Sister    Other Sister        Congestive Heart Failure   Diabetes Sister    Breast cancer Maternal Aunt    Colon cancer Neg Hx     Social History Social History   Tobacco Use   Smoking status: Former    Types: Cigarettes    Quit date: 10/12/1982    Years since quitting: 39.8   Smokeless tobacco: Never   Tobacco comments:    smoked many years ago and can';t qunatify   Vaping Use   Vaping Use: Never used  Substance Use Topics   Alcohol use: No    Alcohol/week: 0.0 standard drinks of alcohol   Drug use: No     Allergies   Adhesive [tape] and Sulfonamide derivatives   Review of Systems Review of Systems  Constitutional:  Negative for chills and fever.  Eyes:  Negative for discharge and redness.  Gastrointestinal:  Negative for abdominal pain, nausea and vomiting.  Musculoskeletal:  Positive  for arthralgias and joint swelling.  Neurological:  Negative for numbness.     Physical Exam Triage Vital Signs ED Triage Vitals [08/12/22 1634]  Enc Vitals Group     BP 132/81     Pulse Rate 69     Resp 18     Temp 98 F (36.7 C)     Temp Source Oral     SpO2 96 %     Weight      Height      Head Circumference      Peak Flow      Pain Score 8     Pain Loc      Pain Edu?      Excl. in [place]?    No data found.  Updated Vital Signs BP 132/81 (BP Location: Right Arm)   Pulse 69   Temp 98 F (36.7 C) (Oral)   Resp 18   SpO2 96%      Physical Exam Vitals and nursing note reviewed.  Constitutional:      General: She is not in acute distress.    Appearance: Normal appearance. She is not ill-appearing.  HENT:     Head: Normocephalic and atraumatic.  Eyes:     Conjunctiva/sclera: Conjunctivae normal.  Cardiovascular:     Rate and Rhythm: Normal rate.  Pulmonary:     Effort: Pulmonary effort is normal. No respiratory distress.  Musculoskeletal:     Comments: Mild swelling appreciated to left wrist with tenderness to palpation noted diffusely seemingly worse to radial aspect.  Patient is hesitant to move wrist, elbow or fingers on left side.  Full range of motion of right wrist with mild pain associated.  No significant swelling of right wrist.  No swelling appreciated to left knee with full range of motion.  Small superficial abrasion noted to anterior knee, not bleeding.  Skin:    Capillary Refill: Normal cap refill to bilateral fingers Neurological:     Mental Status: She is alert.     Comments: Gross sensation intact to bilateral fingers distally  Psychiatric:        Mood and Affect: Mood normal.        Behavior: Behavior normal.  Thought Content: Thought content normal.      UC Treatments / Results  Labs (all labs ordered are listed, but only abnormal results are displayed) Labs Reviewed - No data to display  EKG   Radiology DG Wrist Complete  Right  Result Date: 08/12/2022 CLINICAL DATA:  Bilateral wrist pain after fall. EXAM: RIGHT WRIST - COMPLETE 3+ VIEW COMPARISON:  None Available. FINDINGS: There is diffuse decreased bone mineralization. Mild-to-moderate thumb metacarpophalangeal and interphalangeal joint space narrowing and peripheral osteophytosis. Neutral ulnar variance. On lateral view there is a tiny 3 mm ossicle just dorsal to the midcarpal row suspicious for a dorsal aspect of the triquetral fracture. No dislocation. IMPRESSION: On lateral view there is a tiny 3 mm ossicle just dorsal to the midcarpal row suspicious for an acute triquetral fracture. Recommend clinical correlation for point tenderness. Electronically Signed   By: Yvonne Kendall M.D.   On: 08/12/2022 17:11   DG Wrist Complete Left  Result Date: 08/12/2022 CLINICAL DATA:  Bilateral wrist pain after fall. EXAM: LEFT WRIST - COMPLETE 3+ VIEW COMPARISON:  Left hand radiographs 07/30/2010 FINDINGS: There is diffuse decreased bone mineralization. Mild thumb carpometacarpal peripheral degenerative spurring. New mild cortical irregularity/2 mm cortical step-off at the lateral aspect of the distal radial metaphysis on frontal view with mild cortical irregularity/2 mm dorsal cortical step-off of the distal radial physeal region on lateral view. Moderate dorsal wrist soft tissue swelling in this region. IMPRESSION: Acute nondisplaced fracture of the distal radius at the metaphysis and epiphysis, with moderate dorsal wrist soft tissue swelling. Electronically Signed   By: Yvonne Kendall M.D.   On: 08/12/2022 17:08    Procedures Procedures (including critical care time)  Medications Ordered in UC Medications - No data to display  Initial Impression / Assessment and Plan / UC Course  I have reviewed the triage vital signs and the nursing notes.  Pertinent labs & imaging results that were available during my care of the patient were reviewed by me and considered in my medical  decision making (see chart for details).    Discussed bilateral wrist fractures and braces applied in office.  Recommended patient call Ortho today to hopefully schedule appointment in the near future, if unable to schedule appointment recommended she walk into ortho urgent care.  Encouraged follow-up sooner with any further concerns.  Discussed ibuprofen and Tylenol for pain if needed.  Final Clinical Impressions(s) / UC Diagnoses   Final diagnoses:  Closed fracture of right wrist, initial encounter  Closed fracture of left wrist, initial encounter   Discharge Instructions   None    ED Prescriptions   None    PDMP not reviewed this encounter.   Francene Finders, PA-C 08/13/22 1009

## 2022-08-12 NOTE — ED Triage Notes (Signed)
Pt c/o fall onto sidewalk at park today. Concerned for pain and edema in left wrist. Also c/o pain in right wrist and left knee.

## 2022-08-13 ENCOUNTER — Encounter: Payer: Self-pay | Admitting: Physician Assistant

## 2022-08-13 DIAGNOSIS — M25531 Pain in right wrist: Secondary | ICD-10-CM | POA: Diagnosis not present

## 2022-08-13 DIAGNOSIS — M25532 Pain in left wrist: Secondary | ICD-10-CM | POA: Diagnosis not present

## 2022-08-13 DIAGNOSIS — S52502A Unspecified fracture of the lower end of left radius, initial encounter for closed fracture: Secondary | ICD-10-CM | POA: Diagnosis not present

## 2022-08-13 DIAGNOSIS — S63501A Unspecified sprain of right wrist, initial encounter: Secondary | ICD-10-CM | POA: Diagnosis not present

## 2022-08-14 ENCOUNTER — Ambulatory Visit: Payer: Medicare Other

## 2022-08-18 ENCOUNTER — Ambulatory Visit: Payer: Medicare Other

## 2022-08-19 DIAGNOSIS — M25641 Stiffness of right hand, not elsewhere classified: Secondary | ICD-10-CM | POA: Diagnosis not present

## 2022-08-19 DIAGNOSIS — M25642 Stiffness of left hand, not elsewhere classified: Secondary | ICD-10-CM | POA: Diagnosis not present

## 2022-09-10 DIAGNOSIS — M25532 Pain in left wrist: Secondary | ICD-10-CM | POA: Diagnosis not present

## 2022-09-10 DIAGNOSIS — M25531 Pain in right wrist: Secondary | ICD-10-CM | POA: Diagnosis not present

## 2022-09-10 DIAGNOSIS — S63501A Unspecified sprain of right wrist, initial encounter: Secondary | ICD-10-CM | POA: Diagnosis not present

## 2022-09-10 DIAGNOSIS — S52502A Unspecified fracture of the lower end of left radius, initial encounter for closed fracture: Secondary | ICD-10-CM | POA: Diagnosis not present

## 2022-09-17 DIAGNOSIS — M25632 Stiffness of left wrist, not elsewhere classified: Secondary | ICD-10-CM | POA: Diagnosis not present

## 2022-09-18 DIAGNOSIS — Z23 Encounter for immunization: Secondary | ICD-10-CM | POA: Diagnosis not present

## 2022-09-24 DIAGNOSIS — M25632 Stiffness of left wrist, not elsewhere classified: Secondary | ICD-10-CM | POA: Diagnosis not present

## 2022-09-28 DIAGNOSIS — M25632 Stiffness of left wrist, not elsewhere classified: Secondary | ICD-10-CM | POA: Diagnosis not present

## 2022-10-01 DIAGNOSIS — M25632 Stiffness of left wrist, not elsewhere classified: Secondary | ICD-10-CM | POA: Diagnosis not present

## 2022-10-06 DIAGNOSIS — M25632 Stiffness of left wrist, not elsewhere classified: Secondary | ICD-10-CM | POA: Diagnosis not present

## 2022-10-08 DIAGNOSIS — S63501A Unspecified sprain of right wrist, initial encounter: Secondary | ICD-10-CM | POA: Diagnosis not present

## 2022-10-08 DIAGNOSIS — M25531 Pain in right wrist: Secondary | ICD-10-CM | POA: Diagnosis not present

## 2022-10-08 DIAGNOSIS — S52502A Unspecified fracture of the lower end of left radius, initial encounter for closed fracture: Secondary | ICD-10-CM | POA: Diagnosis not present

## 2022-10-08 DIAGNOSIS — M25532 Pain in left wrist: Secondary | ICD-10-CM | POA: Diagnosis not present

## 2022-10-09 DIAGNOSIS — M25632 Stiffness of left wrist, not elsewhere classified: Secondary | ICD-10-CM | POA: Diagnosis not present

## 2022-10-15 DIAGNOSIS — M25632 Stiffness of left wrist, not elsewhere classified: Secondary | ICD-10-CM | POA: Diagnosis not present

## 2022-10-22 DIAGNOSIS — Z961 Presence of intraocular lens: Secondary | ICD-10-CM | POA: Diagnosis not present

## 2022-10-23 DIAGNOSIS — M25632 Stiffness of left wrist, not elsewhere classified: Secondary | ICD-10-CM | POA: Diagnosis not present

## 2022-10-29 DIAGNOSIS — M25632 Stiffness of left wrist, not elsewhere classified: Secondary | ICD-10-CM | POA: Diagnosis not present

## 2022-11-02 ENCOUNTER — Ambulatory Visit
Admission: RE | Admit: 2022-11-02 | Discharge: 2022-11-02 | Disposition: A | Discharge status: Home / Self Care | Payer: Medicare Other | Source: Ambulatory Visit | Attending: Internal Medicine | Admitting: Internal Medicine

## 2022-11-02 DIAGNOSIS — Z1231 Encounter for screening mammogram for malignant neoplasm of breast: Secondary | ICD-10-CM | POA: Diagnosis not present

## 2022-11-05 DIAGNOSIS — M25632 Stiffness of left wrist, not elsewhere classified: Secondary | ICD-10-CM | POA: Diagnosis not present

## 2022-11-09 DIAGNOSIS — S63501A Unspecified sprain of right wrist, initial encounter: Secondary | ICD-10-CM | POA: Diagnosis not present

## 2022-11-09 DIAGNOSIS — S52502A Unspecified fracture of the lower end of left radius, initial encounter for closed fracture: Secondary | ICD-10-CM | POA: Diagnosis not present

## 2022-11-09 DIAGNOSIS — M25532 Pain in left wrist: Secondary | ICD-10-CM | POA: Diagnosis not present

## 2022-11-24 LAB — CBC AND DIFFERENTIAL
Hemoglobin: 15.2 (ref 12.0–16.0)
Platelets: 183 10*3/uL (ref 150–400)
WBC: 5.9

## 2022-11-24 LAB — LIPID PANEL
Cholesterol: 185 (ref 0–200)
Triglycerides: 56 (ref 40–160)

## 2022-11-24 LAB — HEMOGLOBIN A1C: Hemoglobin A1C: 6

## 2022-11-24 LAB — VITAMIN D 25 HYDROXY (VIT D DEFICIENCY, FRACTURES): Vit D, 25-Hydroxy: 47.39

## 2022-11-24 LAB — BASIC METABOLIC PANEL
BUN: 13 (ref 4–21)
CO2: 30 — AB (ref 13–22)
Chloride: 106 (ref 99–108)
Creatinine: 0.7 (ref 0.5–1.1)
Glucose: 107
Potassium: 4.1 mEq/L (ref 3.5–5.1)
Sodium: 140 (ref 137–147)

## 2022-11-24 LAB — HEPATIC FUNCTION PANEL
ALT: 18 U/L (ref 7–35)
AST: 13 (ref 13–35)

## 2022-11-24 LAB — COMPREHENSIVE METABOLIC PANEL
Albumin: 3.9 (ref 3.5–5.0)
Calcium: 9.4 (ref 8.7–10.7)

## 2022-11-24 LAB — CBC: RBC: 5.55 — AB (ref 3.87–5.11)

## 2022-11-24 LAB — TSH: TSH: 1.78 (ref 0.41–5.90)

## 2023-01-05 ENCOUNTER — Encounter: Payer: Self-pay | Admitting: Internal Medicine

## 2023-01-05 ENCOUNTER — Ambulatory Visit (INDEPENDENT_AMBULATORY_CARE_PROVIDER_SITE_OTHER): Payer: Medicare Other | Admitting: Internal Medicine

## 2023-01-05 VITALS — BP 110/82 | HR 96 | Temp 97.5°F | Ht 67.0 in | Wt 138.0 lb

## 2023-01-05 DIAGNOSIS — L259 Unspecified contact dermatitis, unspecified cause: Secondary | ICD-10-CM | POA: Diagnosis not present

## 2023-01-05 MED ORDER — FLUOCINONIDE EMULSIFIED BASE 0.05 % EX CREA: 1.0000 | TOPICAL_CREAM | Freq: Two times a day (BID) | CUTANEOUS | 1 refills | Status: AC

## 2023-01-05 NOTE — Progress Notes (Signed)
Chief Complaint  Patient presents with   Rash    Pt reports itching around neck. Going on for couple of wks now.     HPI: Elizabeth Rollins 72 y.o. come in for  weeks of rash itchy on neck left neck and some on right  Used old fluticasone 0.5  and not helpful   thiks it is contact derm   ,had ezema as a child.   No known contactant   jewelry  clothes scarves etc. Feels lik in the creases   Has labs routine from the [place] for our records  (Basically normal)  Had trip in past year   fx both wrists and had rehab doing  ok now.  ROS: See pertinent positives and negatives per HPI.  Past Medical History:  Diagnosis Date   Enlarged uterus 2-02   TAH & BSO   Miscarriage    Varicella    hx as a child    Family History  Problem Relation Age of Onset   Pulmonary embolism Mother        died 6   Hypertension Mother    Heart attack Father        died 44   Diabetes Father    Heart disease Father    Allergies Sister    Diabetes Brother    Diabetes Brother    Diabetes Sister    Other Sister        Congestive Heart Failure   Diabetes Sister    Breast cancer Maternal Aunt    Colon cancer Neg Hx     Social History   Socioeconomic History   Marital status: Married    Spouse name: Not on file   Number of children: Not on file   Years of education: Not on file   Highest education level: Not on file  Occupational History   Not on file  Tobacco Use   Smoking status: Former    Types: Cigarettes    Quit date: 10/12/1982    Years since quitting: 40.2   Smokeless tobacco: Never   Tobacco comments:    smoked many years ago and can';t qunatify   Vaping Use   Vaping Use: Never used  Substance and Sexual Activity   Alcohol use: No    Alcohol/week: 0.0 standard drinks of alcohol   Drug use: No   Sexual activity: Not Currently    Birth control/protection: Surgical    Comment: declined sexual Hx questions  Other Topics Concern   Not on file  Social History Narrative    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    Am tobacco   Regular exercise- yes water aerobic    hh of 2  Husband retiring   2017       Social Determinants of Health   Financial Resource Strain: Low Risk  (11/21/2020)   Overall Financial Resource Strain (CARDIA)    Difficulty of Paying Living Expenses: Not hard at all  Food Insecurity: No Food Insecurity (11/21/2020)   Hunger Vital Sign    Worried About Running Out of Food in the Last Year: Never true    Ran Out of Food in the Last Year: Never true  Transportation Needs: No Transportation Needs (11/21/2020)   PRAPARE - Hydrologist (Medical): No    Lack of Transportation (Non-Medical): No  Physical Activity: Inactive (11/21/2020)   Exercise Vital Sign    Days of Exercise per Week: 0  days    Minutes of Exercise per Session: 0 min  Stress: No Stress Concern Present (11/21/2020)   [place]    Feeling of Stress : Not at all  Social Connections: Moderately Isolated (11/21/2020)   Social Connection and Isolation Panel [NHANES]    Frequency of Communication with Friends and Family: More than three times a week    Frequency of Social Gatherings with Friends and Family: Never    Attends Religious Services: Never    Marine scientist or Organizations: No    Attends Archivist Meetings: Never    Marital Status: Married    Outpatient Medications Prior to Visit  Medication Sig Dispense Refill   Ascorbic Acid (VITA-C PO) Take by mouth.     CALCIUM PO Take by mouth.     cetirizine (ZYRTEC) 10 MG tablet TAKE ONE TABLET BY MOUTH DAILY FOR ALLERGIC RHINITIS     Cholecalciferol (VITAMIN D) 2000 UNITS tablet Take 2,000 Units by mouth daily.     [employer]S ARTIFICIAL TEARS 1-0.3 % SOLN Place 1 drop into both eyes 4 (four) times daily.  6   fish oil-omega-3 fatty acids 1000 MG capsule Take 2 g by mouth daily.      fluticasone (FLONASE) 50 MCG/ACT nasal spray INSTILL 1 SPRAY IN EACH NOSTRIL DAILY FOR ALLERGIES     MAGNESIUM PO Take 500 mg by mouth 2 (two) times daily.     No facility-administered medications prior to visit.     EXAM:  BP 110/82 (BP Location: Left Arm, Patient Position: Sitting, Cuff Size: Normal)   Pulse 96   Temp (!) 97.5 F (36.4 C) (Oral)   Ht 5\' 7"  (1.702 m)   Wt 138 lb (62.6 kg)   SpO2 96%   BMI 21.61 kg/m   Body mass index is 21.61 kg/m.  GENERAL: vitals reviewed and listed above, alert, oriented, appears well hydrated and in no acute distress HEENT: atraumatic, conjunctiva  clear, no obvious abnormalities on inspection of external nose and ears NECK: no obvious masses on inspection palpation  Skin left lateral neck withblotchy  patches of bumps  mild redness no vesicle no pustules  LMS: moves all extremities without noticeable focal  abnormality PSYCH: pleasant and cooperative, no obvious depression or anxiety Lab Results  Component Value Date   WBC 5.9 11/24/2022   HGB 15.2 11/24/2022   HCT 40 12/09/2021   PLT 183 11/24/2022   GLUCOSE 97 11/26/2020   CHOL 185 11/24/2022   TRIG 56 11/24/2022   HDL 73 (A) 12/09/2021   LDLCALC 87 12/09/2021   ALT 18 11/24/2022   AST 13 11/24/2022   NA 140 11/24/2022   K 4.1 11/24/2022   CL 106 11/24/2022   CREATININE 0.7 11/24/2022   BUN 13 11/24/2022   CO2 30 (A) 11/24/2022   TSH 1.78 11/24/2022   HGBA1C 6.0 11/24/2022   BP Readings from Last 3 Encounters:  01/05/23 110/82  08/12/22 132/81  05/20/22 110/70  Lab results reviewed  sent to scan after abstract  ASSESSMENT AND PLAN:  Discussed the following assessment and plan:  Contact dermatitis, unspecified contact dermatitis type, unspecified trigger  -Patient advised to return or notify health care team  if  new concerns arise.  Patient Instructions  Good to see you today   Try stronger   steroid topical  for 1-2 weeks   to calm it down .  Avoid any  irritants.  I agree looks  like a  contact irritant.    Standley Brooking. Lillybeth Tal M.D.

## 2023-01-05 NOTE — Patient Instructions (Addendum)
Good to see you today   Try stronger   steroid topical  for 1-2 weeks   to calm it down .  Avoid any irritants.  I agree looks like a  contact irritant.

## 2023-01-19 ENCOUNTER — Telehealth: Payer: Self-pay | Admitting: Internal Medicine

## 2023-01-19 NOTE — Telephone Encounter (Signed)
Contacted Elizabeth Rollins to schedule their annual wellness visit. Patient declined to schedule AWV at this time.  [name] AWV direct phone # [phone]   Patient declined stating she has already had this done

## 2023-03-01 IMAGING — MG MM DIGITAL DIAGNOSTIC UNILAT*L* W/ TOMO W/ CAD
6 series · 6 of 18 positions shown · non-contrast
Comparison: Previous exam(s).

CLINICAL DATA: Two small masses felt by the patient in the
posterior upper outer left breast since a left arm CJYWM-LV booster
on 02/10/2021.

EXAM:
DIGITAL DIAGNOSTIC UNILATERAL LEFT MAMMOGRAM WITH TOMOSYNTHESIS AND
CAD; ULTRASOUND LEFT BREAST LIMITED
TECHNIQUE: Left digital diagnostic mammography and breast tomosynthesis was
performed. The images were evaluated with computer-aided detection.;
Targeted ultrasound examination of the left breast was performed

[L MLO synth-2D (1 of 2)]
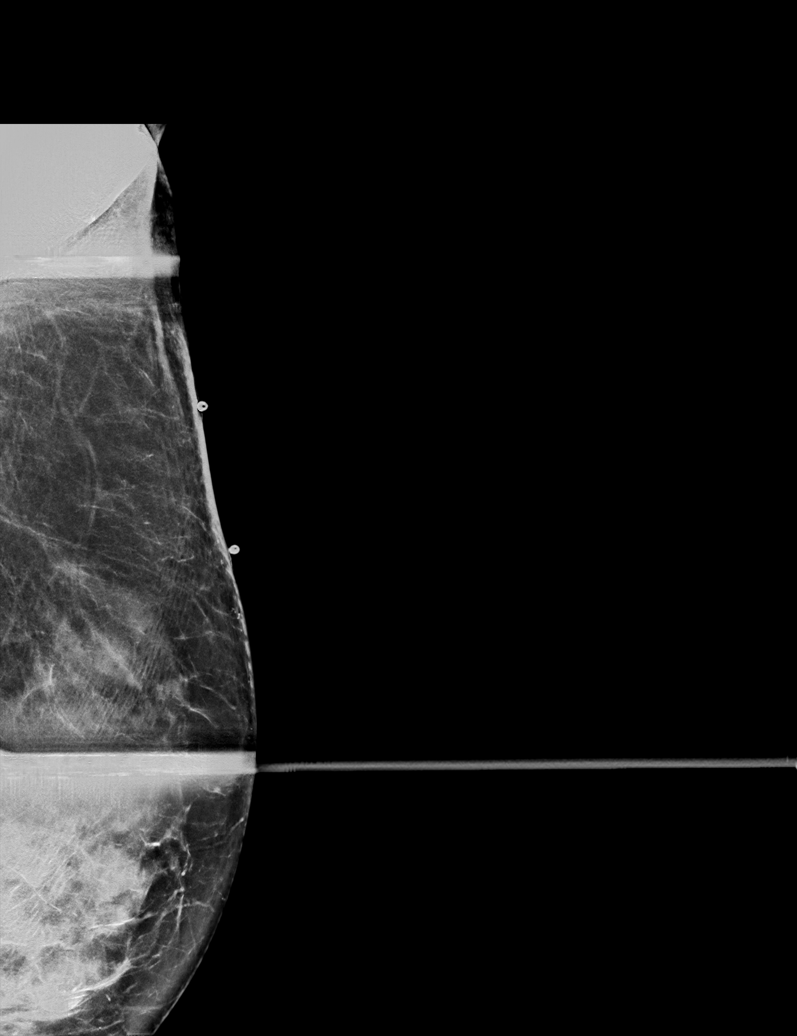

[L MLO synth-2D (2 of 2)]
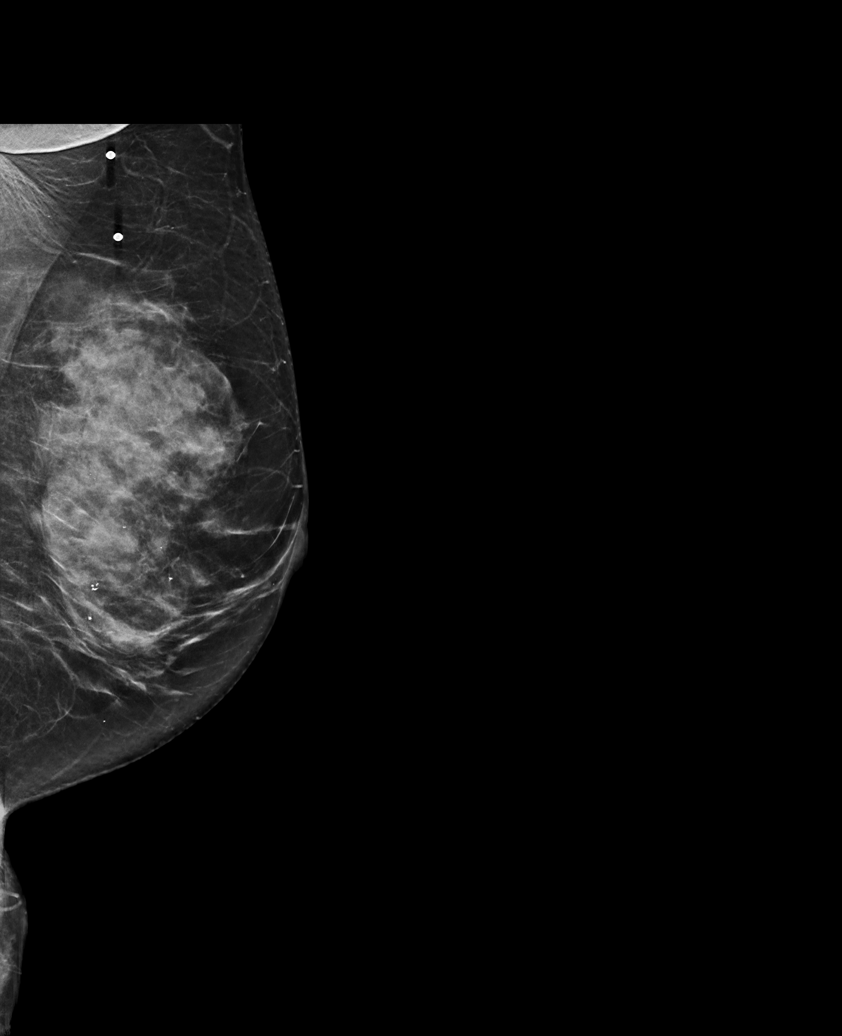

[L CC synth-2D]
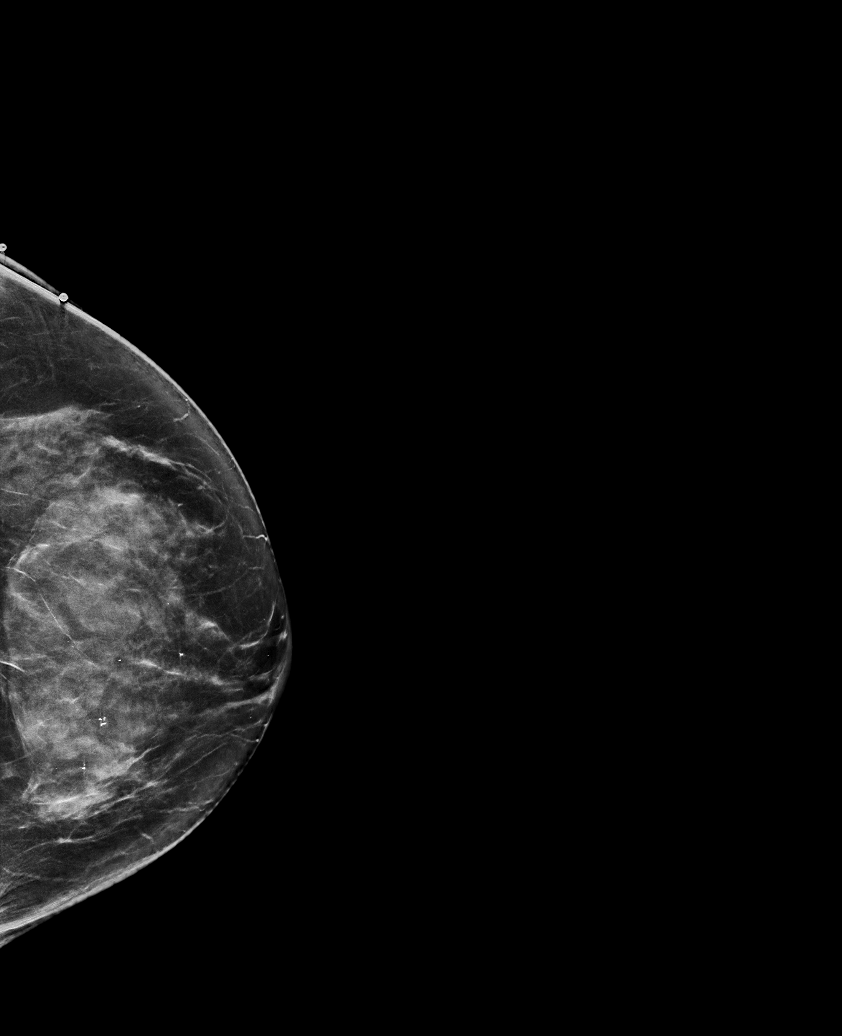

[L MLO tomo (1 of 2) · tomo slice 36/71.0]
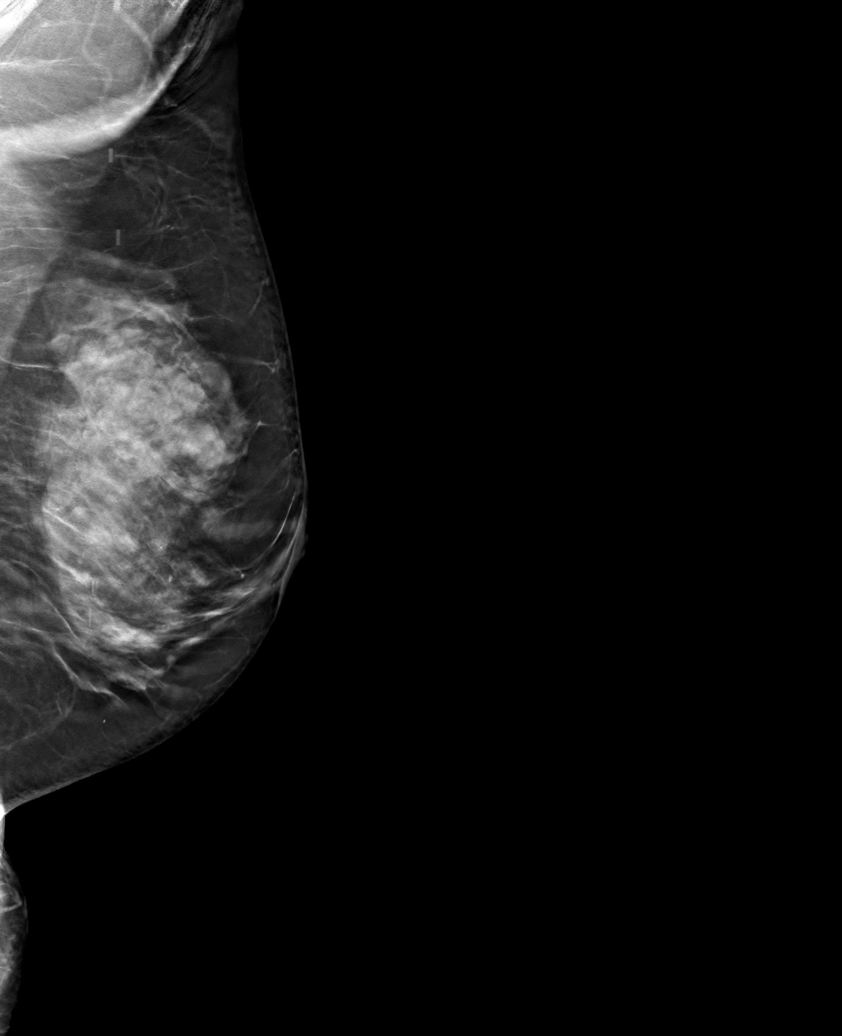

[L CC tomo · tomo slice 37/73.0]
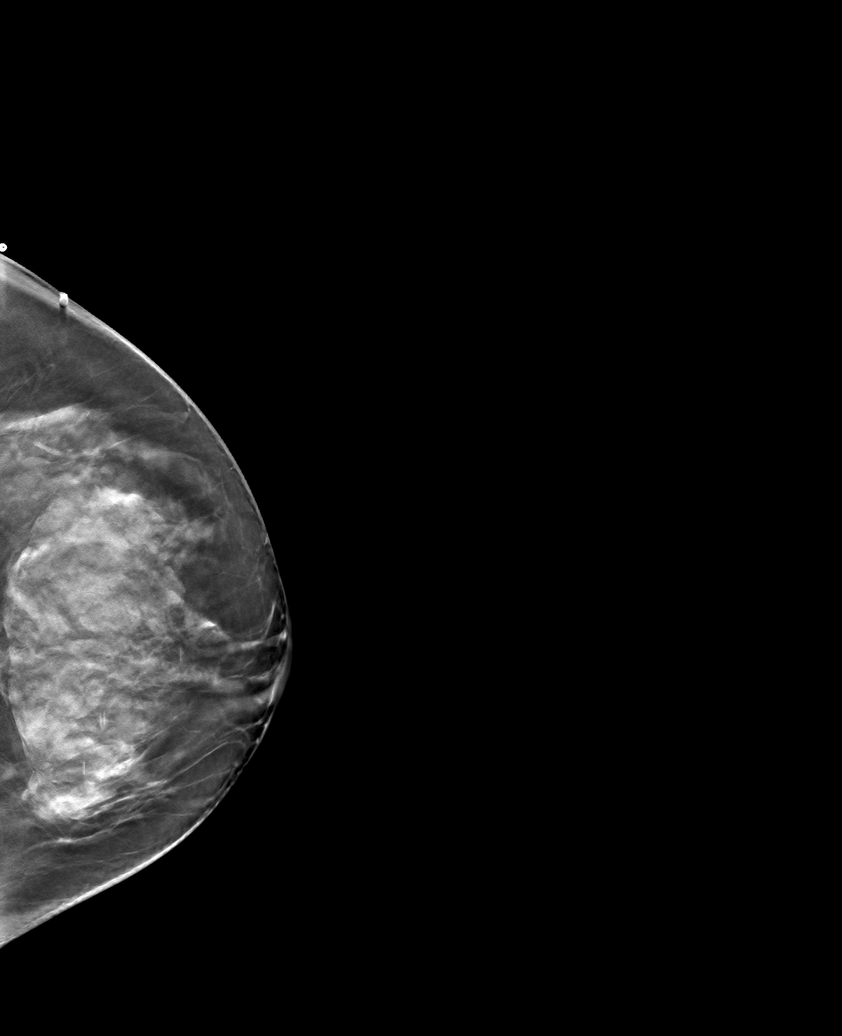

[L MLO tomo (2 of 2) · tomo slice 38/75.0]
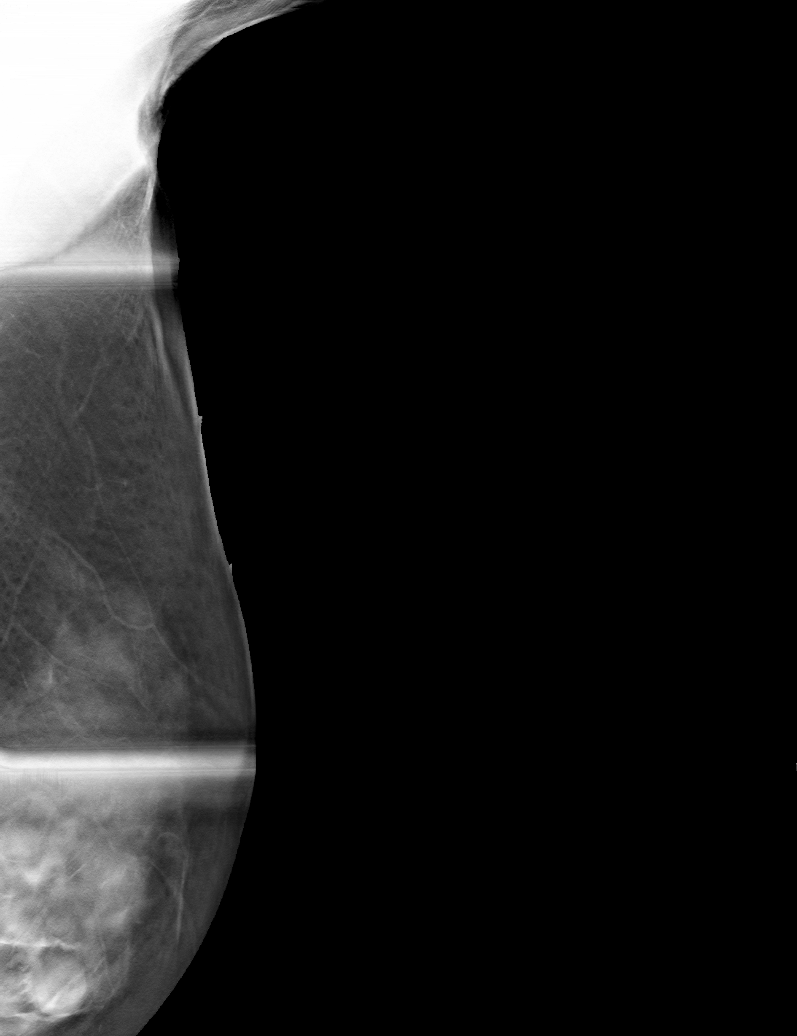

[6 of 18 positions shown; findings below may reference images not displayed]

ACR Breast Density Category c: The breast tissue is heterogeneously
dense, which may obscure small masses.
FINDINGS: Stable mammographic appearance of the left breast with no interval
findings suspicious for malignancy. Normal appearing fatty tissue at
the locations of the masses felt by the patient, marked with
metallic markers.

On physical exam, the patient described 2 additional small lumps
that she feels in the outer left breast. There were small areas of
focal, mass like thickening in the areas of palpable patient
concern.

Targeted ultrasound is performed, showing normal appearing glandular
tissue and normal appearing fat lobules in the areas of patient
concern in the outer left breast. No masses or other findings
suspicious for malignancy were seen.
IMPRESSION: No evidence of malignancy

RECOMMENDATION:
Bilateral screening mammogram in 3 months when due. That will be 1
year since mammographic evaluation of the right breast.

I have discussed the findings and recommendations with the patient.
If applicable, a reminder letter will be sent to the patient
regarding the next appointment.

BI-RADS CATEGORY  1: Negative.

## 2023-03-01 IMAGING — US US BREAST*L* LIMITED INC AXILLA
1 series · 6 of 6 positions shown · non-contrast
Comparison: Previous exam(s).

CLINICAL DATA: Two small masses felt by the patient in the
posterior upper outer left breast since a left arm CJYWM-LV booster
on 02/10/2021.

EXAM:
DIGITAL DIAGNOSTIC UNILATERAL LEFT MAMMOGRAM WITH TOMOSYNTHESIS AND
CAD; ULTRASOUND LEFT BREAST LIMITED
TECHNIQUE: Left digital diagnostic mammography and breast tomosynthesis was
performed. The images were evaluated with computer-aided detection.;
Targeted ultrasound examination of the left breast was performed

[Series 1: us breast*left* limited inc axilla · 0.06mm/px · 6 of 6 slices shown]
[im 1/6]
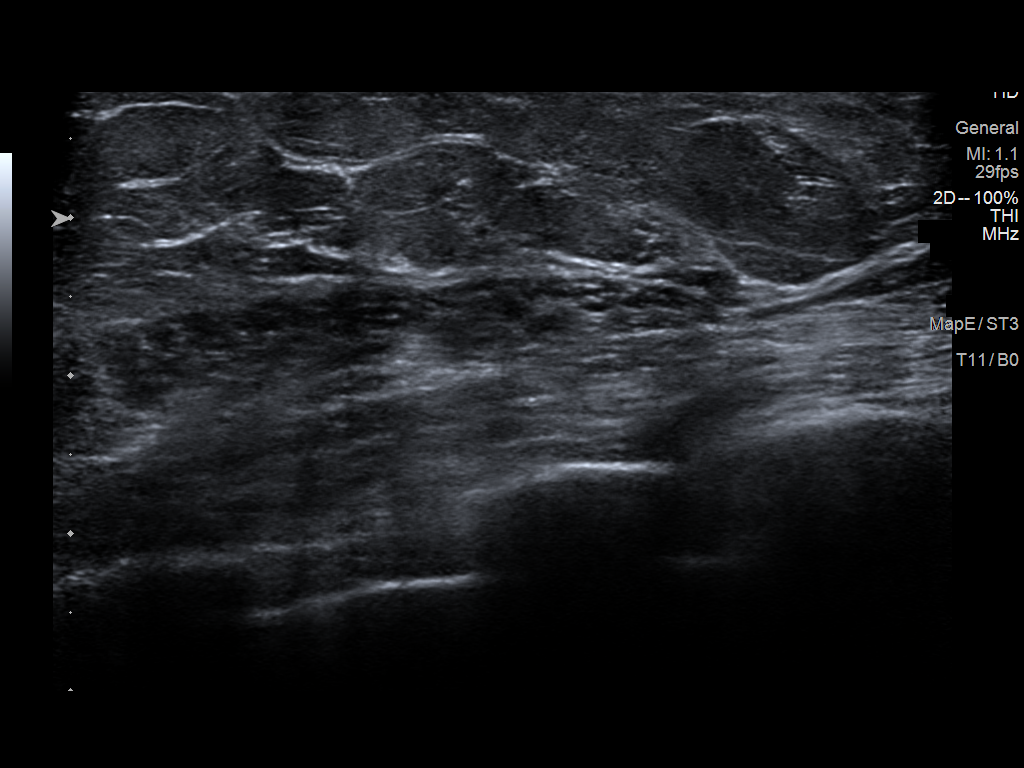
[im 2/6]
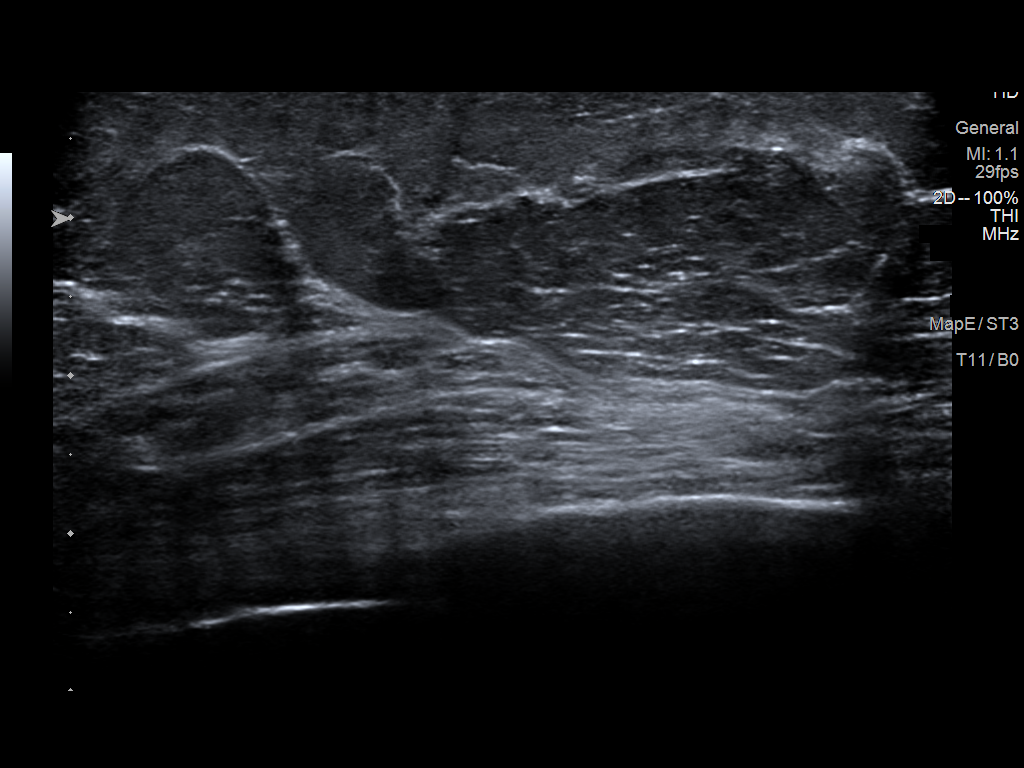
[im 3/6]
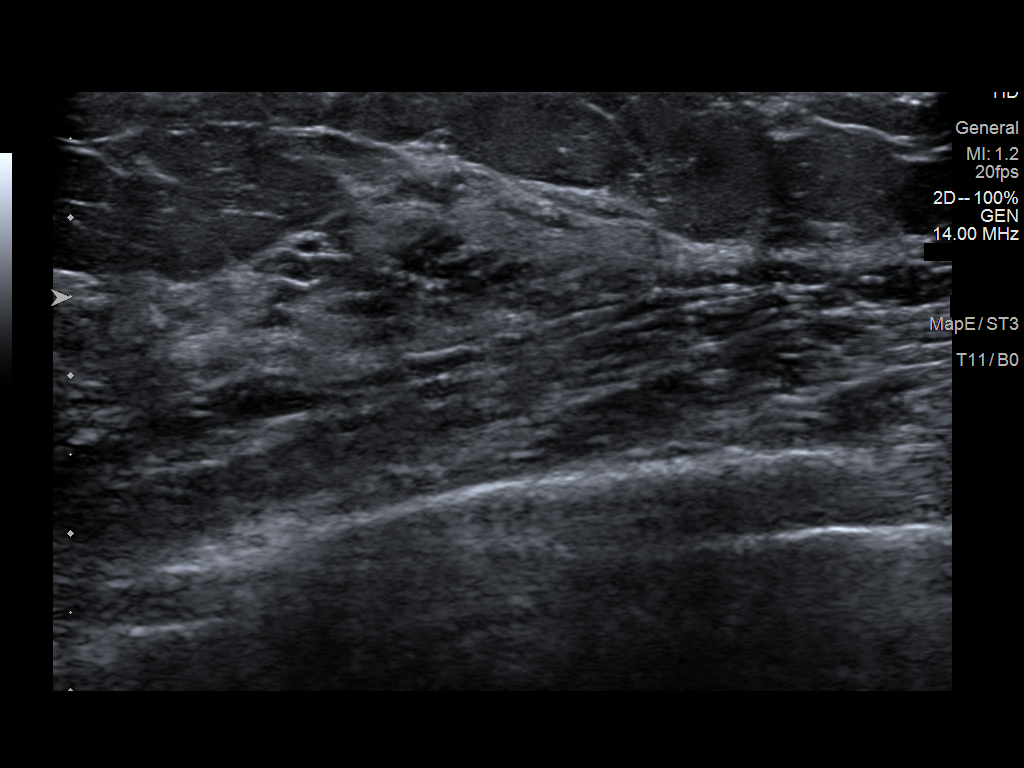
[im 4/6]
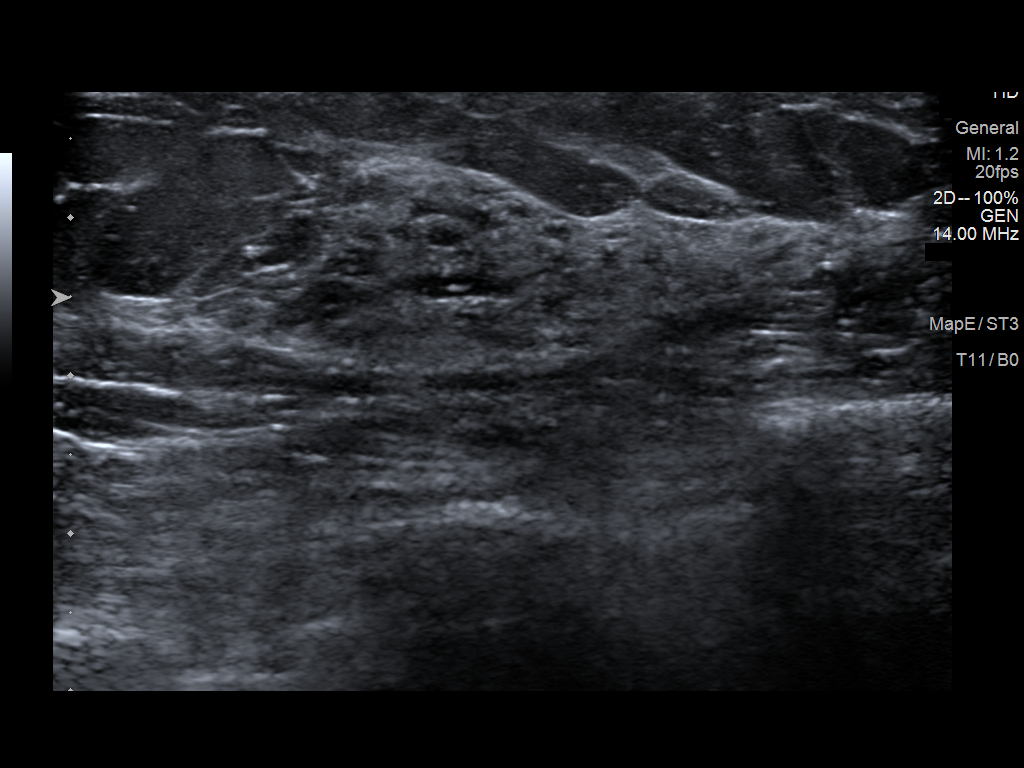
[im 5/6]
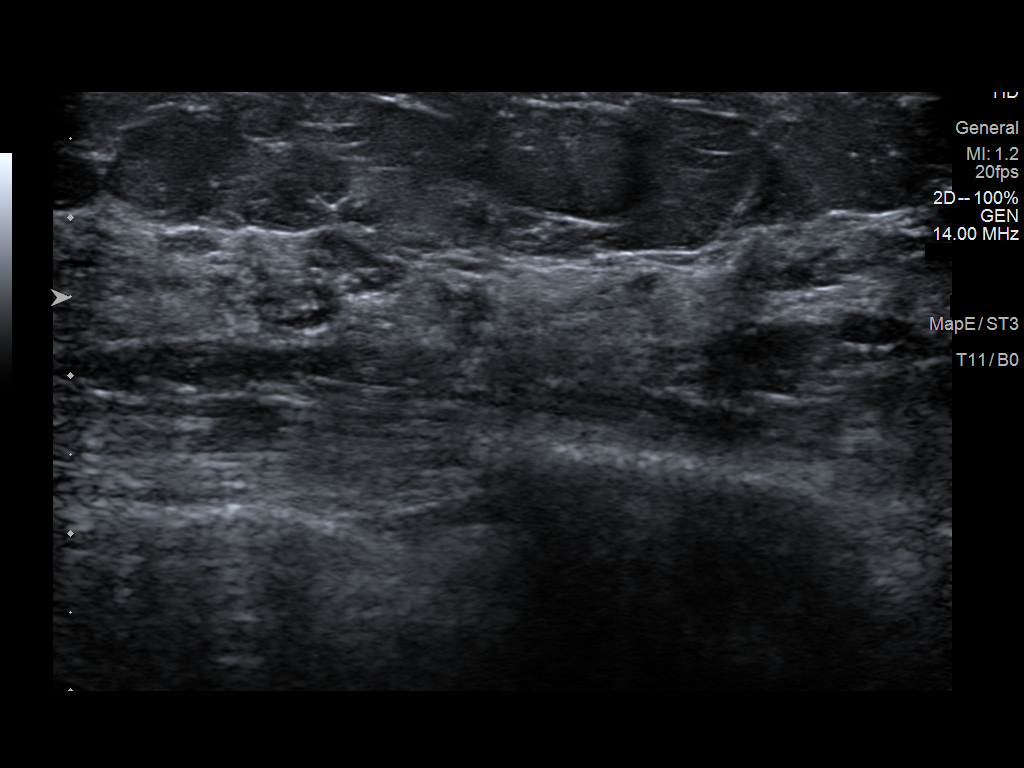
[im 6/6]
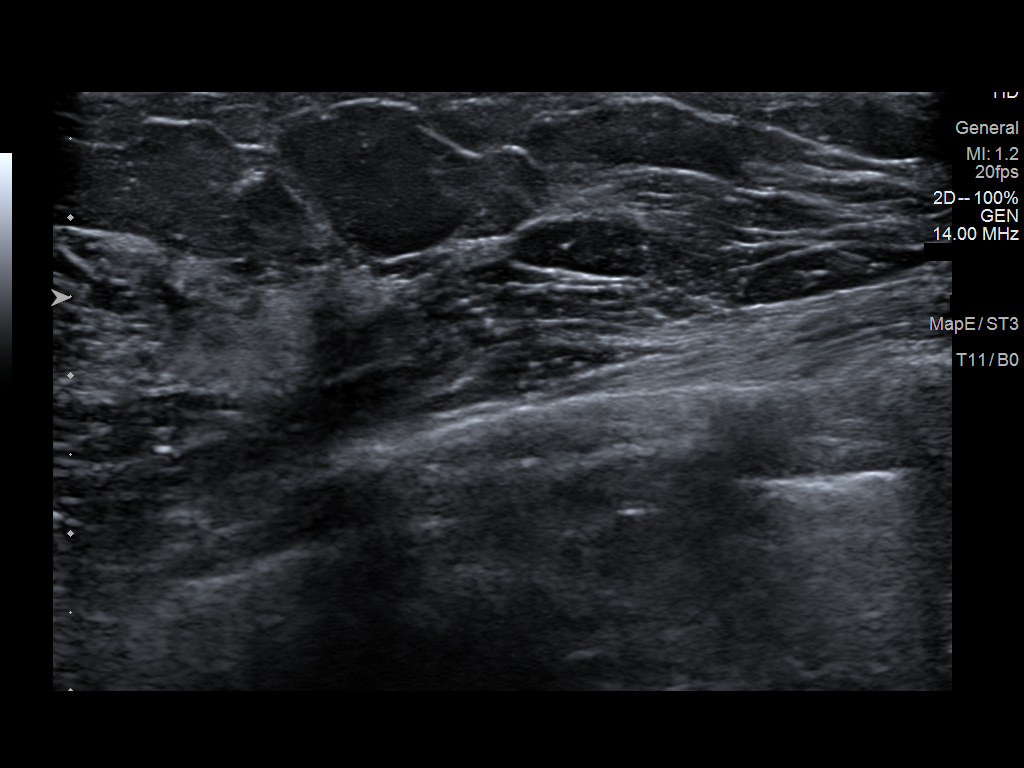

[6 of 6 positions shown; findings below may reference images not displayed]

ACR Breast Density Category c: The breast tissue is heterogeneously
dense, which may obscure small masses.
FINDINGS: Stable mammographic appearance of the left breast with no interval
findings suspicious for malignancy. Normal appearing fatty tissue at
the locations of the masses felt by the patient, marked with
metallic markers.

On physical exam, the patient described 2 additional small lumps
that she feels in the outer left breast. There were small areas of
focal, mass like thickening in the areas of palpable patient
concern.

Targeted ultrasound is performed, showing normal appearing glandular
tissue and normal appearing fat lobules in the areas of patient
concern in the outer left breast. No masses or other findings
suspicious for malignancy were seen.
IMPRESSION: No evidence of malignancy

RECOMMENDATION:
Bilateral screening mammogram in 3 months when due. That will be 1
year since mammographic evaluation of the right breast.

I have discussed the findings and recommendations with the patient.
If applicable, a reminder letter will be sent to the patient
regarding the next appointment.

BI-RADS CATEGORY  1: Negative.

## 2023-07-16 ENCOUNTER — Ambulatory Visit (INDEPENDENT_AMBULATORY_CARE_PROVIDER_SITE_OTHER): Payer: Medicare Other

## 2023-07-16 DIAGNOSIS — Z23 Encounter for immunization: Secondary | ICD-10-CM | POA: Diagnosis not present

## 2023-07-19 DIAGNOSIS — Z23 Encounter for immunization: Secondary | ICD-10-CM | POA: Diagnosis not present

## 2023-08-06 ENCOUNTER — Other Ambulatory Visit: Payer: Self-pay | Admitting: Internal Medicine

## 2023-08-06 DIAGNOSIS — Z1231 Encounter for screening mammogram for malignant neoplasm of breast: Secondary | ICD-10-CM

## 2023-10-19 LAB — LIPID PANEL
Cholesterol: 188 (ref 0–200)
HDL: 65 (ref 35–70)
LDL Cholesterol: 106
Triglycerides: 67 (ref 40–160)

## 2023-10-19 LAB — HEPATIC FUNCTION PANEL
ALT: 11 U/L (ref 7–35)
AST: 17 (ref 13–35)
Bilirubin, Direct: 0.3
Bilirubin, Total: 1.1

## 2023-10-19 LAB — BASIC METABOLIC PANEL
BUN: 8 (ref 4–21)
CO2: 27 — AB (ref 13–22)
Chloride: 107 (ref 99–108)
Creatinine: 0.8 (ref 0.5–1.1)
Glucose: 104
Potassium: 3.8 meq/L (ref 3.5–5.1)
Sodium: 141 (ref 137–147)

## 2023-10-19 LAB — CBC AND DIFFERENTIAL
HCT: 42 (ref 36–46)
Hemoglobin: 14.6 (ref 12.0–16.0)
Platelets: 184 10*3/uL (ref 150–400)
WBC: 4.8

## 2023-10-19 LAB — COMPREHENSIVE METABOLIC PANEL
Albumin: 4.5 (ref 3.5–5.0)
Calcium: 9.7 (ref 8.7–10.7)

## 2023-10-19 LAB — CBC: RBC: 5.24 — AB (ref 3.87–5.11)

## 2023-10-19 LAB — HEMOGLOBIN A1C: Hemoglobin A1C: 6

## 2023-10-19 LAB — VITAMIN D 25 HYDROXY (VIT D DEFICIENCY, FRACTURES): Vit D, 25-Hydroxy: 49.2

## 2023-11-03 DIAGNOSIS — Z961 Presence of intraocular lens: Secondary | ICD-10-CM | POA: Diagnosis not present

## 2023-11-03 DIAGNOSIS — H524 Presbyopia: Secondary | ICD-10-CM | POA: Diagnosis not present

## 2023-11-05 ENCOUNTER — Ambulatory Visit
Admission: RE | Admit: 2023-11-05 | Discharge: 2023-11-05 | Disposition: A | Discharge status: Home / Self Care | Payer: Medicare Other | Source: Ambulatory Visit | Attending: Internal Medicine | Admitting: Internal Medicine

## 2023-11-05 DIAGNOSIS — Z1231 Encounter for screening mammogram for malignant neoplasm of breast: Secondary | ICD-10-CM

## 2023-12-06 ENCOUNTER — Encounter: Payer: Self-pay | Admitting: Internal Medicine

## 2023-12-06 ENCOUNTER — Ambulatory Visit: Payer: Self-pay | Admitting: Internal Medicine

## 2023-12-06 NOTE — Telephone Encounter (Signed)
 Copied from CRM [id number]. Topic: Clinical - Red Word Triage >> Dec 06, 2023  8:36 AM Elizabeth Rollins wrote: Patient states she had a fall back in December. Patient has some concerns with both knees states she's having lack of motion, stiffness and pain 6/10  This encounter was created in error - please disregard. This encounter was created in error - please disregard.

## 2023-12-06 NOTE — Telephone Encounter (Signed)
 Chief Complaint: bilateral knee pain Symptoms: 6/10 knee pain Frequency: since December, getting worse Pertinent Negatives: Patient denies recent falls, head strikes, swelling, redness, fever Disposition: [] ED /[] Urgent Care (no appt availability in office) / [x] Appointment(In office/virtual)/ []  [facility]/ [] Home Care/ [] Refused Recommended Disposition /[] [facility]/ []  Follow-up with PCP Additional Notes: 6/10 bilateral knee pain gradually getting worse since pt fell in December last year. Tripped over uneven pavement, no LOC. No dizziness or weakness today. States it feels like arthritis. Per protocol pt scheduled tomorrow 2/25 in the office at 0930. Pt denies swelling, redness, fever. Still able to walk. RN advised pt to call back for worsening, pt verbalized understanding.    Reason for Disposition  [1] MODERATE pain (e.g., interferes with normal activities, limping) AND [2] present > 3 days  Answer Assessment - Initial Assessment Questions 1. MECHANISM: "How did the fall happen?"     "I was taking a walk", uneven pavement 3. ONSET: "When did the fall happen?" (e.g., minutes, hours, or days ago)     December 4. LOCATION: "What part of the body hit the ground?" (e.g., back, buttocks, head, hips, knees, hands, head, stomach)     Landed on her knees on the cement 5. INJURY: "Did you hurt (injure) yourself when you fell?" If Yes, ask: "What did you injure? Tell me more about this?" (e.g., body area; type of injury; pain severity)"     "Little cut on my left knee", "wasn't bad", pain to both knees 6. PAIN: "Is there any pain?" If Yes, ask: "How bad is the pain?" (e.g., Scale 1-10; or mild,  moderate, severe)   - NONE (0): No pain   - MILD (1-3): Doesn't interfere with normal activities    - MODERATE (4-7): Interferes with normal activities or awakens from sleep    - SEVERE (8-10): Excruciating pain, unable to do any normal activities      6/10 - "depends on  what I do with my leg", R knee is worse 7. SIZE: For cuts, bruises, or swelling, ask: "How large is it?" (e.g., inches or centimeters)      Healed 9. OTHER SYMPTOMS: "Do you have any other symptoms?" (e.g., dizziness, fever, weakness; new onset or worsening).      "Other than my knees I don't have a problem", no swelling 10. CAUSE: "What do you think caused the fall (or falling)?" (e.g., tripped, dizzy spell)       Uneven pavement  Answer Assessment - Initial Assessment Questions 1. LOCATION and RADIATION: "Where is the pain located?"      Bilateral knees 2. QUALITY: "What does the pain feel like?"  (e.g., sharp, dull, aching, burning)     "Like arthritis" 3. SEVERITY: "How bad is the pain?" "What does it keep you from doing?"   (Scale 1-10; or mild, moderate, severe)   -  MILD (1-3): doesn't interfere with normal activities    -  MODERATE (4-7): interferes with normal activities (e.g., work or school) or awakens from sleep, limping    -  SEVERE (8-10): excruciating pain, unable to do any normal activities, unable to walk     6/10 - sore when sitting for a while and getting up, still able to do normal activities 4. ONSET: "When did the pain start?" "Does it come and go, or is it there all the time?"     December, gradually 5. RECURRENT: "Have you had this pain before?" If Yes, ask: "When, and what happened then?"  No 6. SETTING: "Has there been any recent work, exercise or other activity that involved that part of the body?"      No 7. AGGRAVATING FACTORS: "What makes the knee pain worse?" (e.g., walking, climbing stairs, running)     Worse with movement 8. ASSOCIATED SYMPTOMS: "Is there any swelling or redness of the knee?"     No 9. OTHER SYMPTOMS: "Do you have any other symptoms?" (e.g., chest pain, difficulty breathing, fever, calf pain) No other symptoms  Protocols used: Falls and Falling-A-AH, Knee Pain-A-AH

## 2023-12-07 ENCOUNTER — Encounter: Payer: Self-pay | Admitting: Internal Medicine

## 2023-12-07 ENCOUNTER — Ambulatory Visit: Payer: Medicare Other

## 2023-12-07 ENCOUNTER — Encounter: Payer: Self-pay | Admitting: Family Medicine

## 2023-12-07 ENCOUNTER — Ambulatory Visit (INDEPENDENT_AMBULATORY_CARE_PROVIDER_SITE_OTHER): Payer: Medicare Other | Admitting: Family Medicine

## 2023-12-07 VITALS — BP 120/76 | HR 73 | Resp 16 | Ht 67.0 in | Wt 137.1 lb

## 2023-12-07 DIAGNOSIS — M25562 Pain in left knee: Secondary | ICD-10-CM | POA: Diagnosis not present

## 2023-12-07 DIAGNOSIS — M25561 Pain in right knee: Secondary | ICD-10-CM | POA: Diagnosis not present

## 2023-12-07 DIAGNOSIS — W19XXXA Unspecified fall, initial encounter: Secondary | ICD-10-CM | POA: Diagnosis not present

## 2023-12-07 MED ORDER — MELOXICAM 7.5 MG PO TABS: 7.5000 mg | ORAL_TABLET | Freq: Every day | ORAL | 0 refills | Status: AC

## 2023-12-07 NOTE — Patient Instructions (Addendum)
 A few things to remember from today's visit:  Pain in both knees, unspecified chronicity - Plan: meloxicam (MOBIC) 7.5 MG tablet, DG Knee Complete 4 Views Right Try Meloxicam once daily. Avoid activities that aggravate pain.  If you need refills for medications you take chronically, please call your pharmacy. Do not use My Chart to request refills or for acute issues that need immediate attention. If you send a my chart message, it may take a few days to be addressed, specially if I am not in the office.  Please be sure medication list is accurate. If a new problem present, please set up appointment sooner than planned today.

## 2023-12-07 NOTE — Progress Notes (Signed)
 ACUTE VISIT Chief Complaint  Patient presents with   Knee Pain    Both knees, had a fall in December; has tried water therapy. Having sharp pains when moving to stand up from low sitting furniture, achy.    HPI: [name] is a 73 y.o. female, who is here today complaining of bilateral knee pain as described above.  She endorses sharp anterior knee pain and stiffness bilaterally, starting about 2-3 weeks ago but has had pain since fall in 09/2023.It has been mostly achy but sharp pain for the past couple weeks. It happens 2-3 times per week.  Right knee pain greater than left.  She rates her pain a 5/10, and is worse in the mornings.  + Stiffness.  Knee Pain  The incident occurred more than 1 week ago. The incident occurred in the street. The injury mechanism was a fall. The pain is present in the left knee and right knee. The pain is moderate. The pain has been Intermittent since onset. Pertinent negatives include no inability to bear weight, loss of motion, loss of sensation, muscle weakness, numbness or tingling. The symptoms are aggravated by movement. She has tried nothing for the symptoms.   This tends to mostly occur when getting up from a low sitting position or getting out of the car.  Pt has not taken any medications to manage her pain, but has tried massaging her knee.  She has not noted significant limitation of ROM, edema, or erythema.  She also reports a previous fall about a year ago with distal radius fracture to both wrists.   Review of Systems  Constitutional:  Negative for appetite change, chills and fever.  Respiratory:  Negative for cough and shortness of breath.   Cardiovascular:  Negative for chest pain and leg swelling.  Gastrointestinal:  Negative for abdominal pain and nausea.  Genitourinary:  Negative for decreased urine volume, dysuria and hematuria.  Skin:  Negative for rash.  Neurological:  Negative for tingling, weakness and numbness.   See other pertinent positives and negatives in HPI.  Current Outpatient Medications on File Prior to Visit  Medication Sig Dispense Refill   Ascorbic Acid (VITA-C PO) Take by mouth.     CALCIUM PO Take by mouth.     cetirizine (ZYRTEC) 10 MG tablet TAKE ONE TABLET BY MOUTH DAILY FOR ALLERGIC RHINITIS     Cholecalciferol (VITAMIN D) 2000 UNITS tablet Take 2,000 Units by mouth daily.     [employer] ARTIFICIAL TEARS 1-0.3 % SOLN Place 1 drop into both eyes 4 (four) times daily.  6   fish oil-omega-3 fatty acids 1000 MG capsule Take 2 g by mouth daily.     fluocinonide-emollient (LIDEX-E) 0.05 % cream Apply 1 Application topically 2 (two) times daily. 30 g 1   fluticasone (FLONASE) 50 MCG/ACT nasal spray INSTILL 1 SPRAY IN EACH NOSTRIL DAILY FOR ALLERGIES     MAGNESIUM PO Take 500 mg by mouth 2 (two) times daily.     No current facility-administered medications on file prior to visit.   Past Medical History:  Diagnosis Date   Enlarged uterus 2-02   TAH & BSO   Miscarriage    Varicella    hx as a child   Allergies  Allergen Reactions   Adhesive [Tape]     Whelps and burns   Sulfonamide Derivatives     Joints swell    Social History   Socioeconomic History   Marital status: Married    Spouse  name: Not on file   Number of children: Not on file   Years of education: Not on file   Highest education level: Not on file  Occupational History   Not on file  Tobacco Use   Smoking status: Former    Current packs/day: 0.00    Types: Cigarettes    Quit date: 10/12/1982    Years since quitting: 41.1   Smokeless tobacco: Never   Tobacco comments:    smoked many years ago and can';t qunatify   Vaping Use   Vaping status: Never Used  Substance and Sexual Activity   Alcohol use: No    Alcohol/week: 0.0 standard drinks of alcohol   Drug use: No   Sexual activity: Not Currently    Birth control/protection: Surgical    Comment: declined sexual Hx questions  Other Topics Concern   Not on  file  Social History Narrative   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    Am tobacco   Regular exercise- yes water aerobic    hh of 2  Husband retiring   2017       Social Drivers of [employer] Strain: Low Risk  (11/21/2020)   Overall Financial Resource Strain (CARDIA)    Difficulty of Paying Living Expenses: Not hard at all  Food Insecurity: No Food Insecurity (11/21/2020)   Hunger Vital Sign    Worried About Running Out of Food in the Last Year: Never true    Ran Out of Food in the Last Year: Never true  Transportation Needs: No Transportation Needs (11/21/2020)   PRAPARE - Administrator, Civil Service (Medical): No    Lack of Transportation (Non-Medical): No  Physical Activity: Inactive (11/21/2020)   Exercise Vital Sign    Days of Exercise per Week: 0 days    Minutes of Exercise per Session: 0 min  Stress: No Stress Concern Present (11/21/2020)   [employer] of Occupational Health - Occupational Stress Questionnaire    Feeling of Stress : Not at all  Social Connections: Moderately Isolated (11/21/2020)   Social Connection and Isolation Panel [NHANES]    Frequency of Communication with Friends and Family: More than three times a week    Frequency of Social Gatherings with Friends and Family: Never    Attends Religious Services: Never    Database administrator or Organizations: No    Attends Banker Meetings: Never    Marital Status: Married    Vitals:   12/07/23 0923  BP: 120/76  Pulse: 73  Resp: 16  SpO2: 98%   Body mass index is 21.48 kg/m.  Physical Exam Vitals and nursing note reviewed.  Constitutional:      General: She is not in acute distress.    Appearance: She is well-developed. She is not ill-appearing.  HENT:     Head: Normocephalic and atraumatic.  Eyes:     Conjunctiva/sclera: Conjunctivae normal.  Cardiovascular:     Rate and Rhythm: Normal rate and regular  rhythm.     Pulses:          Dorsalis pedis pulses are 2+ on the right side and 2+ on the left side.  Pulmonary:     Effort: Pulmonary effort is normal. No respiratory distress.  Musculoskeletal:     Right knee: Crepitus present. No effusion, erythema or bony tenderness. Normal range of motion. Normal pulse.     Instability Tests: Anterior drawer test negative.  Posterior drawer test negative.     Left knee: Crepitus present. No effusion, erythema or bony tenderness. Decreased range of motion (minimally limited flexion). Normal pulse.     Instability Tests: Anterior drawer test negative. Posterior drawer test negative.     Right lower leg: No edema.     Left lower leg: No edema.     Comments: Knee: on inspection no significant deformities.  Patellar apprehension test negative.  Skin:    General: Skin is warm.     Findings: No erythema or rash.  Neurological:     Mental Status: She is alert and oriented to person, place, and time.  Psychiatric:        Mood and Affect: Mood and affect normal.   ASSESSMENT AND PLAN:  Ms. Fionna Merriott was seen today for bilateral knee pain.   Pain in both knees, unspecified chronicity We discussed possible etiologies, started after fall in 09/2023. Most likely OA. Right knee worse, so X ray ordered. We discussed prognosis or OA and treatment options. No hx of HTN or renal disease, she agrees with taking Meloxicam, which she has taken before for lower back pain. We discussed some side effects. Can take it along with Tylenol. Knee X ray I do not appreciate fractures. ? Patellar osteophyte. Mild degenerative changes, no intra articular bodies. Pending radiology report.  -     Meloxicam; Take 1 tablet (7.5 mg total) by mouth daily.  Dispense: 30 tablet; Refill: 0 -     DG Knee Complete 4 Views Right; Future  Fall, initial encounter Fall precautions discussed.  I spent a total of 30 minutes in both face to face and non face to face activities  for this visit on the date of this encounter. During this time history was obtained and documented, examination was performed, , and assessment/plan discussed.  Return in about 2 months (around 02/04/2024) for Knee pain wiht PCP.  I, Isabelle Course, acting as a scribe for Quantay Zaremba [place], MD., have documented all relevant documentation on the behalf of Landis Cassaro [place], MD, as directed by  Dashawn Golda [place], MD while in the presence of Denni France [place], MD.  I, Beryl Balz [place], MD, have reviewed all documentation for this visit. The documentation on 12/07/23 for the exam, diagnosis, procedures, and orders are all accurate and complete.  Shawneequa Baldridge G. [place], MD  [facility] [place] office.

## 2023-12-25 ENCOUNTER — Encounter: Payer: Self-pay | Admitting: Family Medicine

## 2024-01-03 ENCOUNTER — Other Ambulatory Visit: Payer: Self-pay | Admitting: Family Medicine

## 2024-01-03 DIAGNOSIS — M25562 Pain in left knee: Secondary | ICD-10-CM

## 2024-02-03 ENCOUNTER — Ambulatory Visit: Payer: Medicare Other | Admitting: Internal Medicine

## 2024-04-20 ENCOUNTER — Ambulatory Visit: Payer: Self-pay

## 2024-04-20 NOTE — Telephone Encounter (Signed)
 FYI Only or Action Required?: Action required by provider: request for appointment.  Patient was last seen in primary care on 12/07/2023 by [place], Elizabeth G, MD.  Called Nurse Triage reporting Knee Pain.  Symptoms began several months ago.  Interventions attempted: Prescription medications: Meloxicam .  Symptoms are: unchanged. Has on going left knee pain since February. Taking Meloxicam .  Triage Disposition: See PCP When Office is Open (Within 3 Days)  Patient/caregiver understands and will follow disposition?: Yes     Copied from CRM [id number]. Topic: Clinical - Red Word Triage >> Apr 20, 2024  8:42 AM Larissa RAMAN wrote: [facility] that prompted transfer to Nurse Triage: Lt knee pain Reason for Disposition  [1] MODERATE pain (e.Rollins., interferes with normal activities, limping) AND [2] present > 3 days  Answer Assessment - Initial Assessment Questions 1. LOCATION and RADIATION: Where is the pain located?      Left knee 2. QUALITY: What does the pain feel like?  (e.Rollins., sharp, dull, aching, burning)     ache 3. SEVERITY: How bad is the pain? What does it keep you from doing?   (Scale 1-10; or mild, moderate, severe)     5 4. ONSET: When did the pain start? Does it come and go, or is it there all the time?     Several months 5. RECURRENT: Have you had this pain before? If Yes, ask: When, and what happened then?     yes 6. SETTING: Has there been any recent work, exercise or other activity that involved that part of the body?      no 7. AGGRAVATING FACTORS: What makes the knee pain worse? (e.Rollins., walking, climbing stairs, running)     no 8. ASSOCIATED SYMPTOMS: Is there any swelling or redness of the knee?     Mild swelling 9. OTHER SYMPTOMS: Do you have any other symptoms? (e.Rollins., calf pain, chest pain, difficulty breathing, fever)     no 10. PREGNANCY: Is there any chance you are pregnant? When was your last menstrual period?       no  Protocols used: Knee  Pain-A-AH

## 2024-04-25 ENCOUNTER — Ambulatory Visit: Admitting: Family Medicine

## 2024-07-27 DIAGNOSIS — Z23 Encounter for immunization: Secondary | ICD-10-CM | POA: Diagnosis not present

## 2024-10-03 ENCOUNTER — Other Ambulatory Visit: Payer: Self-pay | Admitting: Internal Medicine

## 2024-10-03 DIAGNOSIS — Z1231 Encounter for screening mammogram for malignant neoplasm of breast: Secondary | ICD-10-CM

## 2024-11-06 ENCOUNTER — Ambulatory Visit

## 2024-11-17 ENCOUNTER — Ambulatory Visit: Admission: RE | Admit: 2024-11-17 | Source: Ambulatory Visit

## 2024-11-17 DIAGNOSIS — Z1231 Encounter for screening mammogram for malignant neoplasm of breast: Secondary | ICD-10-CM
